# Patient Record
Sex: Female | Born: 1985 | Race: Black or African American | Hispanic: No | Marital: Single | Smoking: Never smoker
Health system: Southern US, Community
[De-identification: ages and names within clinical notes are randomized; demographics above are authoritative.]

## PROBLEM LIST (undated history)

## (undated) ENCOUNTER — Inpatient Hospital Stay (HOSPITAL_COMMUNITY): Payer: Self-pay

## (undated) DIAGNOSIS — M419 Scoliosis, unspecified: Secondary | ICD-10-CM

## (undated) DIAGNOSIS — O139 Gestational [pregnancy-induced] hypertension without significant proteinuria, unspecified trimester: Secondary | ICD-10-CM

## (undated) DIAGNOSIS — O039 Complete or unspecified spontaneous abortion without complication: Secondary | ICD-10-CM

## (undated) DIAGNOSIS — Z3490 Encounter for supervision of normal pregnancy, unspecified, unspecified trimester: Secondary | ICD-10-CM

## (undated) DIAGNOSIS — J45909 Unspecified asthma, uncomplicated: Secondary | ICD-10-CM

## (undated) HISTORY — PX: NO PAST SURGERIES: SHX2092

## (undated) HISTORY — DX: Gestational (pregnancy-induced) hypertension without significant proteinuria, unspecified trimester: O13.9

---
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
## 2008-04-12 ENCOUNTER — Emergency Department (HOSPITAL_COMMUNITY): Admission: EM | Admit: 2008-04-12 | Discharge: 2008-04-12 | Payer: Self-pay | Admitting: Emergency Medicine

## 2010-06-28 ENCOUNTER — Ambulatory Visit: Payer: Self-pay | Admitting: Diagnostic Radiology

## 2010-06-28 ENCOUNTER — Emergency Department (HOSPITAL_BASED_OUTPATIENT_CLINIC_OR_DEPARTMENT_OTHER)
Admission: EM | Admit: 2010-06-28 | Discharge: 2010-06-28 | Payer: Self-pay | Source: Home / Self Care | Admitting: Emergency Medicine

## 2012-10-24 ENCOUNTER — Encounter (HOSPITAL_COMMUNITY): Payer: Self-pay | Admitting: Family Medicine

## 2012-10-24 ENCOUNTER — Emergency Department (HOSPITAL_COMMUNITY)
Admission: EM | Admit: 2012-10-24 | Discharge: 2012-10-24 | Disposition: A | Discharge status: Home / Self Care | Payer: Medicaid Other | Attending: Emergency Medicine | Admitting: Emergency Medicine

## 2012-10-24 ENCOUNTER — Emergency Department (HOSPITAL_COMMUNITY): Payer: Medicaid Other

## 2012-10-24 DIAGNOSIS — M4120 Other idiopathic scoliosis, site unspecified: Secondary | ICD-10-CM | POA: Insufficient documentation

## 2012-10-24 DIAGNOSIS — M2550 Pain in unspecified joint: Secondary | ICD-10-CM | POA: Insufficient documentation

## 2012-10-24 DIAGNOSIS — N949 Unspecified condition associated with female genital organs and menstrual cycle: Secondary | ICD-10-CM | POA: Insufficient documentation

## 2012-10-24 DIAGNOSIS — J029 Acute pharyngitis, unspecified: Secondary | ICD-10-CM | POA: Insufficient documentation

## 2012-10-24 DIAGNOSIS — Z3490 Encounter for supervision of normal pregnancy, unspecified, unspecified trimester: Secondary | ICD-10-CM

## 2012-10-24 DIAGNOSIS — J3489 Other specified disorders of nose and nasal sinuses: Secondary | ICD-10-CM | POA: Insufficient documentation

## 2012-10-24 DIAGNOSIS — O368990 Maternal care for other specified fetal problems, unspecified trimester, not applicable or unspecified: Secondary | ICD-10-CM | POA: Insufficient documentation

## 2012-10-24 DIAGNOSIS — J45909 Unspecified asthma, uncomplicated: Secondary | ICD-10-CM | POA: Insufficient documentation

## 2012-10-24 DIAGNOSIS — R109 Unspecified abdominal pain: Secondary | ICD-10-CM | POA: Insufficient documentation

## 2012-10-24 DIAGNOSIS — O418X90 Other specified disorders of amniotic fluid and membranes, unspecified trimester, not applicable or unspecified: Secondary | ICD-10-CM

## 2012-10-24 HISTORY — DX: Encounter for supervision of normal pregnancy, unspecified, unspecified trimester: Z34.90

## 2012-10-24 HISTORY — DX: Complete or unspecified spontaneous abortion without complication: O03.9

## 2012-10-24 HISTORY — DX: Unspecified asthma, uncomplicated: J45.909

## 2012-10-24 HISTORY — DX: Scoliosis, unspecified: M41.9

## 2012-10-24 LAB — CBC WITH DIFFERENTIAL/PLATELET
Basophils Absolute: 0 10*3/uL (ref 0.0–0.1)
Eosinophils Absolute: 0 10*3/uL (ref 0.0–0.7)
Eosinophils Relative: 0 % (ref 0–5)
HCT: 36.6 % (ref 36.0–46.0)
Hemoglobin: 12.3 g/dL (ref 12.0–15.0)
Lymphocytes Relative: 21 % (ref 12–46)
Lymphs Abs: 1.8 10*3/uL (ref 0.7–4.0)
MCHC: 33.6 g/dL (ref 30.0–36.0)
Monocytes Absolute: 1.7 10*3/uL — ABNORMAL HIGH (ref 0.1–1.0)
RBC: 4.06 MIL/uL (ref 3.87–5.11)
RDW: 13.5 % (ref 11.5–15.5)

## 2012-10-24 LAB — WET PREP, GENITAL: Yeast Wet Prep HPF POC: NONE SEEN

## 2012-10-24 LAB — URINALYSIS, ROUTINE W REFLEX MICROSCOPIC
Glucose, UA: NEGATIVE mg/dL
Hgb urine dipstick: NEGATIVE
Ketones, ur: NEGATIVE mg/dL
Leukocytes, UA: NEGATIVE
Protein, ur: NEGATIVE mg/dL
Specific Gravity, Urine: 1.027 (ref 1.005–1.030)
Urobilinogen, UA: 0.2 mg/dL (ref 0.0–1.0)

## 2012-10-24 LAB — HCG, QUANTITATIVE, PREGNANCY: hCG, Beta Chain, Quant, S: 71627 m[IU]/mL — ABNORMAL HIGH (ref <5)

## 2012-10-24 LAB — BASIC METABOLIC PANEL
Creatinine, Ser: 0.63 mg/dL (ref 0.50–1.10)
Potassium: 3.6 mEq/L (ref 3.5–5.1)
Sodium: 134 mEq/L — ABNORMAL LOW (ref 135–145)

## 2012-10-24 NOTE — ED Notes (Signed)
Per pt sts hoarse and sore throat. sts she is also pregnant and has been having some abdominal cramping. Unsure how ar along she is. sts LMP beginning of October. Hasn't seen a Dr. yet

## 2012-10-24 NOTE — ED Provider Notes (Signed)
Patient placed in CDU by Caesar Bookman for OB ultrasound. Patient is here for abdominal cramping and has received IVF, labs, imaging and pelvic exam.  Pt with positive UPT and hCG of greater than 70,000.   Plan per previous provider is to obtain an OB US to r/o ectopic.  Patient re-evaluated and is resting comfortably, VSS, with no new complaints or concerns at this time.  On exam: hemodynamically stable, NAD, heart w/ RRR, lungs CTAB, Chest & abd non-tender, no peripheral edema or calf tenderness.  BP 118/66  Pulse 103  Temp 98.6 F (37 C) (Oral)  Resp 20  SpO2 100%  LMP 09/01/2012  Discussed with patient current lab and imaging results as well as their care plan, patient questions answered.  Patient is amenable to the plan.   5:46 PM [place] with: Single live IUP with EGA of [redacted] weeks 3 days; small sub-chorionic hemorrhage.  I discussed these findings with the patient and will have her followup with [facility] for prenatal care.  Pt with resolution of abdominal pain.  She states it occurs intermittently, but is not present at this time.  She is already taking her prenatal vitamins and will continue.  I have also discussed reasons to return immediately to the ER.  Patient expresses understanding and agrees with plan.  1. Medications: usual home medications including prenatal vitamins 2. Treatment: rest, drink plenty of fluids 3. Follow Up: Please followup with your primary doctor for discussion of your diagnoses and further evaluation after today's visit; if you do not have a primary care doctor use the resource guide provided to find one; followup with the [facility] obstetrics outpatient clinic   [facility], PA-C 10/24/12 1801

## 2012-10-24 NOTE — ED Provider Notes (Signed)
History     CSN: [id number]  Arrival date & time 10/24/12  6962   First MD Initiated Contact with Patient 10/24/12 1026      Chief Complaint  Patient presents with  . Abdominal Cramping  . Sore Throat    (Consider location/radiation/quality/duration/timing/severity/associated sxs/prior treatment) Patient is a 26 y.o. female presenting with cramps and URI. The history is provided by the patient.  Abdominal Cramping The primary symptoms of the illness include abdominal pain. The primary symptoms of the illness do not include fever, nausea, vomiting, dysuria, vaginal discharge or vaginal bleeding. The current episode started 3 to 5 hours ago.  Symptoms associated with the illness do not include chills. Associated symptoms comments: Patient early in pregnancy, G1P0, with lower abdominal cramping since this morning. She reports that "a doctor" told her she would be due in July, 2014. No vaginal bleeding or discharge. No dyspareunia. She denies fever, nausea, vomiting, dysuria, or change in bowel habits. Marland Kitchen  URI The primary symptoms include sore throat, abdominal pain and arthralgias. Primary symptoms do not include fever, cough, nausea, vomiting or rash.  The sore throat is not accompanied by trouble swallowing.  Symptoms associated with the illness include congestion. The illness is not associated with chills. Associated symptoms comments: Symptoms of nasal congestion, sore throat and joint aches for the past 3 days. No fever. .    Past Medical History  Diagnosis Date  . Asthma   . Pregnancy   . Scoliosis     History reviewed. No pertinent past surgical history.  History reviewed. No pertinent family history.  History  Substance Use Topics  . Smoking status: Never Smoker   . Smokeless tobacco: Not on file  . Alcohol Use: No    OB History    Grav Para Term Preterm Abortions TAB SAB Ect Mult Living   1               Review of Systems  Constitutional: Negative for fever  and chills.  HENT: Positive for congestion and sore throat. Negative for trouble swallowing.   Respiratory: Negative.  Negative for cough.   Cardiovascular: Negative.   Gastrointestinal: Positive for abdominal pain. Negative for nausea and vomiting.  Genitourinary: Positive for pelvic pain. Negative for dysuria, vaginal bleeding and vaginal discharge.  Musculoskeletal: Positive for arthralgias.  Skin: Negative.  Negative for rash.  Neurological: Negative.     Allergies  Review of patient's allergies indicates no known allergies.  Home Medications   Current Outpatient Rx  Name  Route  Sig  Dispense  Refill  . PRENATAL PO   Oral   Take 1 tablet by mouth daily.           BP 118/66  Pulse 103  Temp 98.6 F (37 C) (Oral)  Resp 20  SpO2 100%  LMP 09/01/2012  Physical Exam  Constitutional: She appears well-developed and well-nourished.  HENT:  Head: Normocephalic.  Neck: Normal range of motion. Neck supple.  Cardiovascular: Normal rate and regular rhythm.   Pulmonary/Chest: Effort normal and breath sounds normal.  Abdominal: Soft. Bowel sounds are normal. There is no tenderness. There is no rebound and no guarding.  Genitourinary: Vagina normal. No vaginal discharge found.       Cervical os closed, unremarkable in appearance without redness. No discharge or bleeding. Uterus enlarged. Non-tender cervix and adnexa.   Musculoskeletal: Normal range of motion.  Neurological: She is alert. No cranial nerve deficit.  Skin: Skin is warm and dry. No rash  noted.  Psychiatric: She has a normal mood and affect.    ED Course  Procedures (including critical care time)  Labs Reviewed  URINALYSIS, ROUTINE W REFLEX MICROSCOPIC - Abnormal; Notable for the following:    APPearance CLOUDY (*)     All other components within normal limits  PREGNANCY, URINE - Abnormal; Notable for the following:    Preg Test, Ur POSITIVE (*)     All other components within normal limits  BASIC  METABOLIC PANEL - Abnormal; Notable for the following:    Sodium 134 (*)     All other components within normal limits  CBC WITH DIFFERENTIAL - Abnormal; Notable for the following:    Monocytes Relative 20 (*)     Monocytes Absolute 1.7 (*)     All other components within normal limits  WET PREP, GENITAL - Abnormal; Notable for the following:    Clue Cells Wet Prep HPF POC FEW (*)     WBC, Wet Prep HPF POC FEW (*)     All other components within normal limits  HCG, QUANTITATIVE, PREGNANCY - Abnormal; Notable for the following:    hCG, Beta Chain, Quant, S [id number] (*)     All other components within normal limits  ABO/RH  GC/CHLAMYDIA PROBE AMP   No results found.   No diagnosis found.    MDM  Patient will be getting an ultrasound for evaluation of pelvic pain/cramping. Moved to CDU to await results. Plan: if no acute findings will refer to [facility] OB outpatient clinic for prenatal care.         Rodena Medin, PA-C 10/24/12 1428

## 2012-10-24 NOTE — ED Notes (Signed)
Pt undressed and placed in gown. Pt given red socks

## 2012-10-24 NOTE — ED Provider Notes (Signed)
Medical screening examination/treatment/procedure(s) were performed by non-physician practitioner and as supervising physician I was immediately available for consultation/collaboration.   Dione Booze, MD 10/24/12 [phone]

## 2012-10-24 NOTE — ED Notes (Signed)
Pelvic cart ready and placed in pt room

## 2012-10-25 LAB — GC/CHLAMYDIA PROBE AMP: GC Probe RNA: NEGATIVE

## 2012-11-04 NOTE — L&D Delivery Note (Signed)
I have seen and examined this patient and I agree with the above. Present for delivery. Tara Wheeler 3:15 AM 05/15/2013

## 2012-11-04 NOTE — L&D Delivery Note (Signed)
Delivery Note At 1:29 AM a viable female was delivered via Vaginal, Spontaneous Delivery (Presentation: vertex;  ).  APGAR: 8, 9; weight .   Placenta status: Intact, Spontaneous.  Cord:  with the following complications: None.    Anesthesia: Epidural  Episiotomy: None Lacerations: None Suture Repair:None Est. Blood Loss (mL): 200  Mom to postpartum.  Baby to nursery-stable.  Roxan Hockey, Winnie [place]ky 05/15/2013, 1:42 AM

## 2013-01-03 ENCOUNTER — Encounter (HOSPITAL_COMMUNITY): Payer: Self-pay | Admitting: Obstetrics and Gynecology

## 2013-01-03 ENCOUNTER — Inpatient Hospital Stay (HOSPITAL_COMMUNITY)
Admission: AD | Admit: 2013-01-03 | Discharge: 2013-01-03 | Disposition: A | Discharge status: Home / Self Care | Payer: Medicaid Other | Source: Ambulatory Visit | Attending: Obstetrics & Gynecology | Admitting: Obstetrics & Gynecology

## 2013-01-03 DIAGNOSIS — R109 Unspecified abdominal pain: Secondary | ICD-10-CM | POA: Insufficient documentation

## 2013-01-03 DIAGNOSIS — O99891 Other specified diseases and conditions complicating pregnancy: Secondary | ICD-10-CM | POA: Insufficient documentation

## 2013-01-03 DIAGNOSIS — Z1389 Encounter for screening for other disorder: Secondary | ICD-10-CM

## 2013-01-03 DIAGNOSIS — Z3490 Encounter for supervision of normal pregnancy, unspecified, unspecified trimester: Secondary | ICD-10-CM

## 2013-01-03 DIAGNOSIS — O0930 Supervision of pregnancy with insufficient antenatal care, unspecified trimester: Secondary | ICD-10-CM | POA: Insufficient documentation

## 2013-01-03 LAB — URINALYSIS, ROUTINE W REFLEX MICROSCOPIC
Bilirubin Urine: NEGATIVE
Leukocytes, UA: NEGATIVE
Nitrite: NEGATIVE
Protein, ur: NEGATIVE mg/dL
pH: 7.5 (ref 5.0–8.0)

## 2013-01-03 NOTE — MAU Provider Note (Signed)
Chief Complaint:  Abdominal Cramping   First Provider Initiated Contact with Patient 01/03/13 1123      HPI: Tara Wheeler is a 27 y.o. G2P0100 at [redacted]w[redacted]d who presents to maternity admissions reporting mild abdominal cramping which occurred yesterday.  She denies cramping today.  She came to MAU today because she has not yet had a prenatal appointment and she starts a new job this week and wants to make sure this is Ok.  She reports good fetal movement, denies LOF, vaginal bleeding, vaginal itching/burning, urinary symptoms, h/a, dizziness, n/v, or fever/chills.  .   Past Medical History: Past Medical History  Diagnosis Date  . Asthma   . Pregnancy   . Scoliosis   . Miscarriage     Past obstetric history: OB History   Grav Para Term Preterm Abortions TAB SAB Ect Mult Living   2 1  1       0     # Outc Date GA Lbr Len/2nd Wgt Sex Del Anes PTL Lv   1 PRE  [redacted]w[redacted]d       ND   2 CUR               Past Surgical History: History reviewed. No pertinent past surgical history.  Family History: History reviewed. No pertinent family history.  Social History: History  Substance Use Topics  . Smoking status: Never Smoker   . Smokeless tobacco: Not on file  . Alcohol Use: No    Allergies: No Known Allergies  Meds:  Prescriptions prior to admission  Medication Sig Dispense Refill  . Prenatal Vit-Fe Fumarate-FA (PRENATAL MULTIVITAMIN) TABS Take 1 tablet by mouth daily at 12 noon.        ROS: Pertinent findings in history of present illness.  Physical Exam  Blood pressure 135/72, pulse 103, temperature 98.3 F (36.8 C), temperature source Oral, resp. rate 18, height 5\' 5"  (1.651 m), weight 86.183 kg (190 lb), last menstrual period 09/01/2012. GENERAL: Well-developed, well-nourished female in no acute distress.  HEENT: normocephalic HEART: normal rate RESP: normal effort ABDOMEN: Soft, non-tender, gravid appropriate for gestational age EXTREMITIES: Nontender, no edema NEURO:  alert and oriented SPECULUM EXAM: deferred     FHT:  154 by doppler   Labs: Results for orders placed during the hospital encounter of 01/03/13 (from the past 24 hour(s))  URINALYSIS, ROUTINE W REFLEX MICROSCOPIC     Status: None   Collection Time    01/03/13 11:08 AM      Result Value Range   Color, Urine YELLOW  YELLOW   APPearance CLEAR  CLEAR   Specific Gravity, Urine 1.020  1.005 - 1.030   pH 7.5  5.0 - 8.0   Glucose, UA NEGATIVE  NEGATIVE mg/dL   Hgb urine dipstick NEGATIVE  NEGATIVE   Bilirubin Urine NEGATIVE  NEGATIVE   Ketones, ur NEGATIVE  NEGATIVE mg/dL   Protein, ur NEGATIVE  NEGATIVE mg/dL   Urobilinogen, UA 0.2  0.0 - 1.0 mg/dL   Nitrite NEGATIVE  NEGATIVE   Leukocytes, UA NEGATIVE  NEGATIVE     Assessment: 1. Normal IUP (intrauterine pregnancy) on prenatal ultrasound     Plan: Discharge home Reassurance provided about normal pregnancy, encouraged increase in PO fluids Ok to start new job, recommend pregnancy support belt since she will be standing at work Outpatient detail U/S ordered for this week Keep scheduled prenatal appointment at [facility] on 3/26 Return to MAU as needed  Follow-up Information   Follow up with [facility]. (  Your appointment is scheduled Wednesday 3/26 at 9:30 am with Georges Mouse, nurse-midwife.  Please call [phone] to schedule ultrasound this week.  )    Contact information:   [address] [place] [address] [phone]       Medication List    TAKE these medications       prenatal multivitamin Tabs  Take 1 tablet by mouth daily at 12 noon.        Sharen Counter Certified Nurse-Midwife 01/03/2013 11:41 AM

## 2013-01-03 NOTE — MAU Note (Addendum)
Patient presents to MAU for pregnancy checkup; denies vaginal bleeding, LOF, pain or cramping. Reports she is scheduled for appointment at clinic here on 3/23, but will begin new work assignment soon and wants to make sure baby is okay.  Patient states she has scoliosis and saw two bands at [facility] for back pain in pregnancy. Wants to know what kind to get.

## 2013-01-03 NOTE — MAU Note (Addendum)
Ms. Siedlecki is here today for a "checkup". She is approximately 16 weeks and has not started prenatal care. She was told she was pregnant at [facility] and also that she had a subchorionic hemorrhage. She is scheduled to go to the clinic at the end of March. She has been having cramping in her lower abdomen, denies bleeding.

## 2013-01-04 NOTE — MAU Provider Note (Signed)
Attestation of Attending Supervision of Advanced Practitioner (PA/CNM/NP): Evaluation and management procedures were performed by the Advanced Practitioner under my supervision and collaboration.  I have reviewed the Advanced Practitioner's note and chart, and I agree with the management and plan.  Kylen Ismael, MD, FACOG Attending Obstetrician & Gynecologist Faculty Practice, [facility]  

## 2013-01-06 ENCOUNTER — Ambulatory Visit (HOSPITAL_COMMUNITY)
Admission: RE | Admit: 2013-01-06 | Discharge: 2013-01-06 | Disposition: A | Discharge status: Home / Self Care | Payer: Medicaid Other | Source: Ambulatory Visit | Attending: Advanced Practice Midwife | Admitting: Advanced Practice Midwife

## 2013-01-06 DIAGNOSIS — Z363 Encounter for antenatal screening for malformations: Secondary | ICD-10-CM | POA: Insufficient documentation

## 2013-01-06 DIAGNOSIS — O358XX0 Maternal care for other (suspected) fetal abnormality and damage, not applicable or unspecified: Secondary | ICD-10-CM | POA: Insufficient documentation

## 2013-01-06 DIAGNOSIS — Z3490 Encounter for supervision of normal pregnancy, unspecified, unspecified trimester: Secondary | ICD-10-CM

## 2013-01-06 DIAGNOSIS — Z1389 Encounter for screening for other disorder: Secondary | ICD-10-CM | POA: Insufficient documentation

## 2013-01-08 ENCOUNTER — Encounter (HOSPITAL_COMMUNITY): Payer: Self-pay

## 2013-01-08 ENCOUNTER — Encounter (HOSPITAL_COMMUNITY): Payer: Self-pay | Admitting: Advanced Practice Midwife

## 2013-01-08 DIAGNOSIS — Z3490 Encounter for supervision of normal pregnancy, unspecified, unspecified trimester: Secondary | ICD-10-CM | POA: Insufficient documentation

## 2013-01-08 DIAGNOSIS — O0930 Supervision of pregnancy with insufficient antenatal care, unspecified trimester: Secondary | ICD-10-CM | POA: Insufficient documentation

## 2013-01-27 ENCOUNTER — Encounter: Payer: Medicaid Other | Admitting: Advanced Practice Midwife

## 2013-01-28 ENCOUNTER — Other Ambulatory Visit (HOSPITAL_COMMUNITY)
Admission: RE | Admit: 2013-01-28 | Discharge: 2013-01-28 | Disposition: A | Discharge status: Home / Self Care | Payer: Medicaid Other | Source: Ambulatory Visit | Attending: Obstetrics & Gynecology | Admitting: Obstetrics & Gynecology

## 2013-01-28 ENCOUNTER — Other Ambulatory Visit: Payer: Self-pay | Admitting: Obstetrics & Gynecology

## 2013-01-28 ENCOUNTER — Encounter: Payer: Self-pay | Admitting: Obstetrics & Gynecology

## 2013-01-28 ENCOUNTER — Ambulatory Visit (INDEPENDENT_AMBULATORY_CARE_PROVIDER_SITE_OTHER): Payer: Medicaid Other | Admitting: Obstetrics & Gynecology

## 2013-01-28 VITALS — BP 141/85 | Temp 98.3°F | Wt 193.4 lb

## 2013-01-28 DIAGNOSIS — O0930 Supervision of pregnancy with insufficient antenatal care, unspecified trimester: Secondary | ICD-10-CM

## 2013-01-28 DIAGNOSIS — Z113 Encounter for screening for infections with a predominantly sexual mode of transmission: Secondary | ICD-10-CM | POA: Insufficient documentation

## 2013-01-28 DIAGNOSIS — Z3492 Encounter for supervision of normal pregnancy, unspecified, second trimester: Secondary | ICD-10-CM

## 2013-01-28 DIAGNOSIS — Z01419 Encounter for gynecological examination (general) (routine) without abnormal findings: Secondary | ICD-10-CM | POA: Insufficient documentation

## 2013-01-28 LAB — POCT URINALYSIS DIP (DEVICE)
Hgb urine dipstick: NEGATIVE
Ketones, ur: NEGATIVE mg/dL
Nitrite: NEGATIVE
Protein, ur: 100 mg/dL — AB
Specific Gravity, Urine: 1.02 (ref 1.005–1.030)
pH: 7.5 (ref 5.0–8.0)
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
## 2013-01-28 NOTE — Patient Instructions (Signed)
Pregnancy - Second Trimester The second trimester of pregnancy (3 to 6 months) is a period of rapid growth for you and your baby. At the end of the sixth month, your baby is about 9 inches long and weighs 1 1/2 pounds. You will begin to feel the baby move between 18 and 20 weeks of the pregnancy. This is called quickening. Weight gain is faster. A clear fluid (colostrum) may leak out of your breasts. You may feel small contractions of the womb (uterus). This is known as false labor or Braxton-Hicks contractions. This is like a practice for labor when the baby is ready to be born. Usually, the problems with morning sickness have usually passed by the end of your first trimester. Some women develop small dark blotches (called cholasma, mask of pregnancy) on their face that usually goes away after the baby is born. Exposure to the sun makes the blotches worse. Acne may also develop in some pregnant women and pregnant women who have acne, may find that it goes away. PRENATAL EXAMS  Blood work may continue to be done during prenatal exams. These tests are done to check on your health and the probable health of your baby. Blood work is used to follow your blood levels (hemoglobin). Anemia (low hemoglobin) is common during pregnancy. Iron and vitamins are given to help prevent this. You will also be checked for diabetes between 24 and 28 weeks of the pregnancy. Some of the previous blood tests may be repeated.  The size of the uterus is measured during each visit. This is to make sure that the baby is continuing to grow properly according to the dates of the pregnancy.  Your blood pressure is checked every prenatal visit. This is to make sure you are not getting toxemia.  Your urine is checked to make sure you do not have an infection, diabetes or protein in the urine.  Your weight is checked often to make sure gains are happening at the suggested rate. This is to ensure that both you and your baby are growing  normally.  Sometimes, an ultrasound is performed to confirm the proper growth and development of the baby. This is a test which bounces harmless sound waves off the baby so your caregiver can more accurately determine due dates. Sometimes, a specialized test is done on the amniotic fluid surrounding the baby. This test is called an amniocentesis. The amniotic fluid is obtained by sticking a needle into the belly (abdomen). This is done to check the chromosomes in instances where there is a concern about possible genetic problems with the baby. It is also sometimes done near the end of pregnancy if an early delivery is required. In this case, it is done to help make sure the baby's lungs are mature enough for the baby to live outside of the womb. CHANGES OCCURING IN THE SECOND TRIMESTER OF PREGNANCY Your body goes through many changes during pregnancy. They vary from person to person. Talk to your caregiver about changes you notice that you are concerned about.  During the second trimester, you will likely have an increase in your appetite. It is normal to have cravings for certain foods. This varies from person to person and pregnancy to pregnancy.  Your lower abdomen will begin to bulge.  You may have to urinate more often because the uterus and baby are pressing on your bladder. It is also common to get more bladder infections during pregnancy (pain with urination). You can help this by   drinking lots of fluids and emptying your bladder before and after intercourse.  You may begin to get stretch marks on your hips, abdomen, and breasts. These are normal changes in the body during pregnancy. There are no exercises or medications to take that prevent this change.  You may begin to develop swollen and bulging veins (varicose veins) in your legs. Wearing support hose, elevating your feet for 15 minutes, 3 to 4 times a day and limiting salt in your diet helps lessen the problem.  Heartburn may develop  as the uterus grows and pushes up against the stomach. Antacids recommended by your caregiver helps with this problem. Also, eating smaller meals 4 to 5 times a day helps.  Constipation can be treated with a stool softener or adding bulk to your diet. Drinking lots of fluids, vegetables, fruits, and whole grains are helpful.  Exercising is also helpful. If you have been very active up until your pregnancy, most of these activities can be continued during your pregnancy. If you have been less active, it is helpful to start an exercise program such as walking.  Hemorrhoids (varicose veins in the rectum) may develop at the end of the second trimester. Warm sitz baths and hemorrhoid cream recommended by your caregiver helps hemorrhoid problems.  Backaches may develop during this time of your pregnancy. Avoid heavy lifting, wear low heal shoes and practice good posture to help with backache problems.  Some pregnant women develop tingling and numbness of their hand and fingers because of swelling and tightening of ligaments in the wrist (carpel tunnel syndrome). This goes away after the baby is born.  As your breasts enlarge, you may have to get a bigger bra. Get a comfortable, cotton, support bra. Do not get a nursing bra until the last month of the pregnancy if you will be nursing the baby.  You may get a dark line from your belly button to the pubic area called the linea nigra.  You may develop rosy cheeks because of increase blood flow to the face.  You may develop spider looking lines of the face, neck, arms and chest. These go away after the baby is born. HOME CARE INSTRUCTIONS   It is extremely important to avoid all smoking, herbs, alcohol, and unprescribed drugs during your pregnancy. These chemicals affect the formation and growth of the baby. Avoid these chemicals throughout the pregnancy to ensure the delivery of a healthy infant.  Most of your home care instructions are the same as  suggested for the first trimester of your pregnancy. Keep your caregiver's appointments. Follow your caregiver's instructions regarding medication use, exercise and diet.  During pregnancy, you are providing food for you and your baby. Continue to eat regular, well-balanced meals. Choose foods such as meat, fish, milk and other low fat dairy products, vegetables, fruits, and whole-grain breads and cereals. Your caregiver will tell you of the ideal weight gain.  A physical sexual relationship may be continued up until near the end of pregnancy if there are no other problems. Problems could include early (premature) leaking of amniotic fluid from the membranes, vaginal bleeding, abdominal pain, or other medical or pregnancy problems.  Exercise regularly if there are no restrictions. Check with your caregiver if you are unsure of the safety of some of your exercises. The greatest weight gain will occur in the last 2 trimesters of pregnancy. Exercise will help you:  Control your weight.  Get you in shape for labor and delivery.  Lose weight   after you have the baby.  Wear a good support or jogging bra for breast tenderness during pregnancy. This may help if worn during sleep. Pads or tissues may be used in the bra if you are leaking colostrum.  Do not use hot tubs, steam rooms or saunas throughout the pregnancy.  Wear your seat belt at all times when driving. This protects you and your baby if you are in an accident.  Avoid raw meat, uncooked cheese, cat litter boxes and soil used by cats. These carry germs that can cause birth defects in the baby.  The second trimester is also a good time to visit your dentist for your dental health if this has not been done yet. Getting your teeth cleaned is OK. Use a soft toothbrush. Brush gently during pregnancy.  It is easier to loose urine during pregnancy. Tightening up and strengthening the pelvic muscles will help with this problem. Practice stopping your  urination while you are going to the bathroom. These are the same muscles you need to strengthen. It is also the muscles you would use as if you were trying to stop from passing gas. You can practice tightening these muscles up 10 times a set and repeating this about 3 times per day. Once you know what muscles to tighten up, do not perform these exercises during urination. It is more likely to contribute to an infection by backing up the urine.  Ask for help if you have financial, counseling or nutritional needs during pregnancy. Your caregiver will be able to offer counseling for these needs as well as refer you for other special needs.  Your skin may become oily. If so, wash your face with mild soap, use non-greasy moisturizer and oil or cream based makeup. MEDICATIONS AND DRUG USE IN PREGNANCY  Take prenatal vitamins as directed. The vitamin should contain 1 milligram of folic acid. Keep all vitamins out of reach of children. Only a couple vitamins or tablets containing iron may be fatal to a baby or young child when ingested.  Avoid use of all medications, including herbs, over-the-counter medications, not prescribed or suggested by your caregiver. Only take over-the-counter or prescription medicines for pain, discomfort, or fever as directed by your caregiver. Do not use aspirin.  Let your caregiver also know about herbs you may be using.  Alcohol is related to a number of birth defects. This includes fetal alcohol syndrome. All alcohol, in any form, should be avoided completely. Smoking will cause low birth rate and premature babies.  Street or illegal drugs are very harmful to the baby. They are absolutely forbidden. A baby born to an addicted mother will be addicted at birth. The baby will go through the same withdrawal an adult does. SEEK MEDICAL CARE IF:  You have any concerns or worries during your pregnancy. It is better to call with your questions if you feel they cannot wait, rather  than worry about them. SEEK IMMEDIATE MEDICAL CARE IF:   An unexplained oral temperature above 102 F (38.9 C) develops, or as your caregiver suggests.  You have leaking of fluid from the vagina (birth canal). If leaking membranes are suspected, take your temperature and tell your caregiver of this when you call.  There is vaginal spotting, bleeding, or passing clots. Tell your caregiver of the amount and how many pads are used. Light spotting in pregnancy is common, especially following intercourse.  You develop a bad smelling vaginal discharge with a change in the color from clear   to white.  You continue to feel sick to your stomach (nauseated) and have no relief from remedies suggested. You vomit blood or coffee ground-like materials.  You lose more than 2 pounds of weight or gain more than 2 pounds of weight over 1 week, or as suggested by your caregiver.  You notice swelling of your face, hands, feet, or legs.  You get exposed to German measles and have never had them.  You are exposed to fifth disease or chickenpox.  You develop belly (abdominal) pain. Round ligament discomfort is a common non-cancerous (benign) cause of abdominal pain in pregnancy. Your caregiver still must evaluate you.  You develop a bad headache that does not go away.  You develop fever, diarrhea, pain with urination, or shortness of breath.  You develop visual problems, blurry, or double vision.  You fall or are in a car accident or any kind of trauma.  There is mental or physical violence at home. Document Released: 10/15/2001 Document Revised: 01/13/2012 Document Reviewed: 04/19/2009 [employer] Patient Information 2013 [employer].  

## 2013-01-29 LAB — OBSTETRIC PANEL
Antibody Screen: NEGATIVE
Eosinophils Absolute: 0.2 10*3/uL (ref 0.0–0.7)
Hepatitis B Surface Ag: NEGATIVE
Lymphocytes Relative: 17 % (ref 12–46)
MCH: 28.5 pg (ref 26.0–34.0)
RBC: 3.72 MIL/uL — ABNORMAL LOW (ref 3.87–5.11)
RDW: 14.6 % (ref 11.5–15.5)
Rh Type: POSITIVE

## 2013-02-01 LAB — HEMOGLOBINOPATHY EVALUATION
Hgb A2 Quant: 2.4 % (ref 2.2–3.2)
Hgb A: 97.3 % (ref 96.8–97.8)
Hgb F Quant: 0.3 % (ref 0.0–2.0)
Hgb S Quant: 0 %

## 2013-02-24 ENCOUNTER — Encounter: Payer: Medicaid Other | Admitting: Obstetrics and Gynecology

## 2013-03-03 ENCOUNTER — Ambulatory Visit (INDEPENDENT_AMBULATORY_CARE_PROVIDER_SITE_OTHER): Payer: Medicaid Other | Admitting: Obstetrics and Gynecology

## 2013-03-03 ENCOUNTER — Other Ambulatory Visit: Payer: Self-pay | Admitting: Obstetrics and Gynecology

## 2013-03-03 VITALS — BP 123/78 | Temp 97.5°F | Wt 193.5 lb

## 2013-03-03 DIAGNOSIS — O0933 Supervision of pregnancy with insufficient antenatal care, third trimester: Secondary | ICD-10-CM

## 2013-03-03 DIAGNOSIS — O0930 Supervision of pregnancy with insufficient antenatal care, unspecified trimester: Secondary | ICD-10-CM

## 2013-03-03 LAB — CBC
Hemoglobin: 10.2 g/dL — ABNORMAL LOW (ref 12.0–15.0)
MCH: 29.1 pg (ref 26.0–34.0)
RBC: 3.5 MIL/uL — ABNORMAL LOW (ref 3.87–5.11)
WBC: 10.3 10*3/uL (ref 4.0–10.5)

## 2013-03-03 LAB — POCT URINALYSIS DIP (DEVICE)
Glucose, UA: NEGATIVE mg/dL
Ketones, ur: NEGATIVE mg/dL
Protein, ur: 30 mg/dL — AB
Specific Gravity, Urine: >=1.03 (ref 1.005–1.030)
Urobilinogen, UA: 0.2 mg/dL (ref 0.0–1.0)

## 2013-03-03 NOTE — Progress Notes (Signed)
Doing well. Only complaint is pedal edema, worse during the day, improves with elevation of feet. Will obtain 1 hr glucola, CBC, RPR, and HIV today.

## 2013-03-03 NOTE — Progress Notes (Signed)
Pulse- 118  Edema-feet

## 2013-03-03 NOTE — Patient Instructions (Addendum)
Pregnancy - Third Trimester  The third trimester of pregnancy (the last 3 months) is a period of the most rapid growth for you and your baby. The baby approaches a length of 20 inches and a weight of 6 to 10 pounds. The baby is adding on fat and getting ready for life outside your body. While inside, babies have periods of sleeping and waking, suck their thumbs, and hiccups. You can often feel small contractions of the uterus. This is false labor. It is also called Braxton-Hicks contractions. This is like a practice for labor. The usual problems in this stage of pregnancy include more difficulty breathing, swelling of the hands and feet from water retention, and having to urinate more often because of the uterus and baby pressing on your bladder.   PRENATAL EXAMS  · Blood work may continue to be done during prenatal exams. These tests are done to check on your health and the probable health of your baby. Blood work is used to follow your blood levels (hemoglobin). Anemia (low hemoglobin) is common during pregnancy. Iron and vitamins are given to help prevent this. You may also continue to be checked for diabetes. Some of the past blood tests may be done again.  · The size of the uterus is measured during each visit. This makes sure your baby is growing properly according to your pregnancy dates.  · Your blood pressure is checked every prenatal visit. This is to make sure you are not getting toxemia.  · Your urine is checked every prenatal visit for infection, diabetes and protein.  · Your weight is checked at each visit. This is done to make sure gains are happening at the suggested rate and that you and your baby are growing normally.  · Sometimes, an ultrasound is performed to confirm the position and the proper growth and development of the baby. This is a test done that bounces harmless sound waves off the baby so your caregiver can more accurately determine due dates.  · Discuss the type of pain medication and  anesthesia you will have during your labor and delivery.  · Discuss the possibility and anesthesia if a Cesarean Section might be necessary.  · Inform your caregiver if there is any mental or physical violence at home.  Sometimes, a specialized non-stress test, contraction stress test and biophysical profile are done to make sure the baby is not having a problem. Checking the amniotic fluid surrounding the baby is called an amniocentesis. The amniotic fluid is removed by sticking a needle into the belly (abdomen). This is sometimes done near the end of pregnancy if an early delivery is required. In this case, it is done to help make sure the baby's lungs are mature enough for the baby to live outside of the womb. If the lungs are not mature and it is unsafe to deliver the baby, an injection of cortisone medication is given to the mother 1 to 2 days before the delivery. This helps the baby's lungs mature and makes it safer to deliver the baby.  CHANGES OCCURING IN THE THIRD TRIMESTER OF PREGNANCY  Your body goes through many changes during pregnancy. They vary from person to person. Talk to your caregiver about changes you notice and are concerned about.  · During the last trimester, you have probably had an increase in your appetite. It is normal to have cravings for certain foods. This varies from person to person and pregnancy to pregnancy.  · You may begin to   get stretch marks on your hips, abdomen, and breasts. These are normal changes in the body during pregnancy. There are no exercises or medications to take which prevent this change.  · Constipation may be treated with a stool softener or adding bulk to your diet. Drinking lots of fluids, fiber in vegetables, fruits, and whole grains are helpful.  · Exercising is also helpful. If you have been very active up until your pregnancy, most of these activities can be continued during your pregnancy. If you have been less active, it is helpful to start an exercise  program such as walking. Consult your caregiver before starting exercise programs.  · Avoid all smoking, alcohol, un-prescribed drugs, herbs and "street drugs" during your pregnancy. These chemicals affect the formation and growth of the baby. Avoid chemicals throughout the pregnancy to ensure the delivery of a healthy infant.  · Backache, varicose veins and hemorrhoids may develop or get worse.  · You will tire more easily in the third trimester, which is normal.  · The baby's movements may be stronger and more often.  · You may become short of breath easily.  · Your belly button may stick out.  · A yellow discharge may leak from your breasts called colostrum.  · You may have a bloody mucus discharge. This usually occurs a few days to a week before labor begins.  HOME CARE INSTRUCTIONS   · Keep your caregiver's appointments. Follow your caregiver's instructions regarding medication use, exercise, and diet.  · During pregnancy, you are providing food for you and your baby. Continue to eat regular, well-balanced meals. Choose foods such as meat, fish, milk and other low fat dairy products, vegetables, fruits, and whole-grain breads and cereals. Your caregiver will tell you of the ideal weight gain.  · A physical sexual relationship may be continued throughout pregnancy if there are no other problems such as early (premature) leaking of amniotic fluid from the membranes, vaginal bleeding, or belly (abdominal) pain.  · Exercise regularly if there are no restrictions. Check with your caregiver if you are unsure of the safety of your exercises. Greater weight gain will occur in the last 2 trimesters of pregnancy. Exercising helps:  · Control your weight.  · Get you in shape for labor and delivery.  · You lose weight after you deliver.  · Rest a lot with legs elevated, or as needed for leg cramps or low back pain.  · Wear a good support or jogging bra for breast tenderness during pregnancy. This may help if worn during  sleep. Pads or tissues may be used in the bra if you are leaking colostrum.  · Do not use hot tubs, steam rooms, or saunas.  · Wear your seat belt when driving. This protects you and your baby if you are in an accident.  · Avoid raw meat, cat litter boxes and soil used by cats. These carry germs that can cause birth defects in the baby.  · It is easier to loose urine during pregnancy. Tightening up and strengthening the pelvic muscles will help with this problem. You can practice stopping your urination while you are going to the bathroom. These are the same muscles you need to strengthen. It is also the muscles you would use if you were trying to stop from passing gas. You can practice tightening these muscles up 10 times a set and repeating this about 3 times per day. Once you know what muscles to tighten up, do not perform these   exercises during urination. It is more likely to cause an infection by backing up the urine.  · Ask for help if you have financial, counseling or nutritional needs during pregnancy. Your caregiver will be able to offer counseling for these needs as well as refer you for other special needs.  · Make a list of emergency phone numbers and have them available.  · Plan on getting help from family or friends when you go home from the hospital.  · Make a trial run to the hospital.  · Take prenatal classes with the father to understand, practice and ask questions about the labor and delivery.  · Prepare the baby's room/nursery.  · Do not travel out of the city unless it is absolutely necessary and with the advice of your caregiver.  · Wear only low or no heal shoes to have better balance and prevent falling.  MEDICATIONS AND DRUG USE IN PREGNANCY  · Take prenatal vitamins as directed. The vitamin should contain 1 milligram of folic acid. Keep all vitamins out of reach of children. Only a couple vitamins or tablets containing iron may be fatal to a baby or young child when ingested.  · Avoid use  of all medications, including herbs, over-the-counter medications, not prescribed or suggested by your caregiver. Only take over-the-counter or prescription medicines for pain, discomfort, or fever as directed by your caregiver. Do not use aspirin, ibuprofen (Motrin®, Advil®, Nuprin®) or naproxen (Aleve®) unless OK'd by your caregiver.  · Let your caregiver also know about herbs you may be using.  · Alcohol is related to a number of birth defects. This includes fetal alcohol syndrome. All alcohol, in any form, should be avoided completely. Smoking will cause low birth rate and premature babies.  · Street/illegal drugs are very harmful to the baby. They are absolutely forbidden. A baby born to an addicted mother will be addicted at birth. The baby will go through the same withdrawal an adult does.  SEEK MEDICAL CARE IF:  You have any concerns or worries during your pregnancy. It is better to call with your questions if you feel they cannot wait, rather than worry about them.  DECISIONS ABOUT CIRCUMCISION  You may or may not know the sex of your baby. If you know your baby is a boy, it may be time to think about circumcision. Circumcision is the removal of the foreskin of the penis. This is the skin that covers the sensitive end of the penis. There is no proven medical need for this. Often this decision is made on what is popular at the time or based upon religious beliefs and social issues. You can discuss these issues with your caregiver or pediatrician.  SEEK IMMEDIATE MEDICAL CARE IF:   · An unexplained oral temperature above 102° F (38.9° C) develops, or as your caregiver suggests.  · You have leaking of fluid from the vagina (birth canal). If leaking membranes are suspected, take your temperature and tell your caregiver of this when you call.  · There is vaginal spotting, bleeding or passing clots. Tell your caregiver of the amount and how many pads are used.  · You develop a bad smelling vaginal discharge with  a change in the color from clear to white.  · You develop vomiting that lasts more than 24 hours.  · You develop chills or fever.  · You develop shortness of breath.  · You develop burning on urination.  · You loose more than 2 pounds of weight   or gain more than 2 pounds of weight or as suggested by your caregiver.  · You notice sudden swelling of your face, hands, and feet or legs.  · You develop belly (abdominal) pain. Round ligament discomfort is a common non-cancerous (benign) cause of abdominal pain in pregnancy. Your caregiver still must evaluate you.  · You develop a severe headache that does not go away.  · You develop visual problems, blurred or double vision.  · If you have not felt your baby move for more than 1 hour. If you think the baby is not moving as much as usual, eat something with sugar in it and lie down on your left side for an hour. The baby should move at least 4 to 5 times per hour. Call right away if your baby moves less than that.  · You fall, are in a car accident or any kind of trauma.  · There is mental or physical violence at home.  Document Released: 10/15/2001 Document Revised: 01/13/2012 Document Reviewed: 04/19/2009  [employer]® Patient Information ©2013 [employer].

## 2013-03-04 LAB — RPR

## 2013-03-04 LAB — HIV ANTIBODY (ROUTINE TESTING W REFLEX): HIV: NONREACTIVE

## 2013-03-17 ENCOUNTER — Ambulatory Visit (INDEPENDENT_AMBULATORY_CARE_PROVIDER_SITE_OTHER): Payer: Medicaid Other | Admitting: Advanced Practice Midwife

## 2013-03-17 VITALS — BP 123/83 | Temp 97.6°F | Wt 197.0 lb

## 2013-03-17 DIAGNOSIS — O0930 Supervision of pregnancy with insufficient antenatal care, unspecified trimester: Secondary | ICD-10-CM

## 2013-03-17 DIAGNOSIS — O0933 Supervision of pregnancy with insufficient antenatal care, third trimester: Secondary | ICD-10-CM

## 2013-03-17 LAB — POCT URINALYSIS DIP (DEVICE)
Bilirubin Urine: NEGATIVE
Urobilinogen, UA: 0.2 mg/dL (ref 0.0–1.0)

## 2013-03-17 NOTE — Progress Notes (Signed)
Well, no c/o. Rev'd PTL, kick counts. 1 hour GCT normal.

## 2013-03-17 NOTE — Progress Notes (Signed)
P=81, 

## 2013-03-17 NOTE — Patient Instructions (Signed)
Pregnancy - Third Trimester  The third trimester of pregnancy (the last 3 months) is a period of the most rapid growth for you and your baby. The baby approaches a length of 20 inches and a weight of 6 to 10 pounds. The baby is adding on fat and getting ready for life outside your body. While inside, babies have periods of sleeping and waking, suck their thumbs, and hiccups. You can often feel small contractions of the uterus. This is false labor. It is also called Braxton-Hicks contractions. This is like a practice for labor. The usual problems in this stage of pregnancy include more difficulty breathing, swelling of the hands and feet from water retention, and having to urinate more often because of the uterus and baby pressing on your bladder.   PRENATAL EXAMS  · Blood work may continue to be done during prenatal exams. These tests are done to check on your health and the probable health of your baby. Blood work is used to follow your blood levels (hemoglobin). Anemia (low hemoglobin) is common during pregnancy. Iron and vitamins are given to help prevent this. You may also continue to be checked for diabetes. Some of the past blood tests may be done again.  · The size of the uterus is measured during each visit. This makes sure your baby is growing properly according to your pregnancy dates.  · Your blood pressure is checked every prenatal visit. This is to make sure you are not getting toxemia.  · Your urine is checked every prenatal visit for infection, diabetes and protein.  · Your weight is checked at each visit. This is done to make sure gains are happening at the suggested rate and that you and your baby are growing normally.  · Sometimes, an ultrasound is performed to confirm the position and the proper growth and development of the baby. This is a test done that bounces harmless sound waves off the baby so your caregiver can more accurately determine due dates.  · Discuss the type of pain medication and  anesthesia you will have during your labor and delivery.  · Discuss the possibility and anesthesia if a Cesarean Section might be necessary.  · Inform your caregiver if there is any mental or physical violence at home.  Sometimes, a specialized non-stress test, contraction stress test and biophysical profile are done to make sure the baby is not having a problem. Checking the amniotic fluid surrounding the baby is called an amniocentesis. The amniotic fluid is removed by sticking a needle into the belly (abdomen). This is sometimes done near the end of pregnancy if an early delivery is required. In this case, it is done to help make sure the baby's lungs are mature enough for the baby to live outside of the womb. If the lungs are not mature and it is unsafe to deliver the baby, an injection of cortisone medication is given to the mother 1 to 2 days before the delivery. This helps the baby's lungs mature and makes it safer to deliver the baby.  CHANGES OCCURING IN THE THIRD TRIMESTER OF PREGNANCY  Your body goes through many changes during pregnancy. They vary from person to person. Talk to your caregiver about changes you notice and are concerned about.  · During the last trimester, you have probably had an increase in your appetite. It is normal to have cravings for certain foods. This varies from person to person and pregnancy to pregnancy.  · You may begin to   get stretch marks on your hips, abdomen, and breasts. These are normal changes in the body during pregnancy. There are no exercises or medications to take which prevent this change.  · Constipation may be treated with a stool softener or adding bulk to your diet. Drinking lots of fluids, fiber in vegetables, fruits, and whole grains are helpful.  · Exercising is also helpful. If you have been very active up until your pregnancy, most of these activities can be continued during your pregnancy. If you have been less active, it is helpful to start an exercise  program such as walking. Consult your caregiver before starting exercise programs.  · Avoid all smoking, alcohol, un-prescribed drugs, herbs and "street drugs" during your pregnancy. These chemicals affect the formation and growth of the baby. Avoid chemicals throughout the pregnancy to ensure the delivery of a healthy infant.  · Backache, varicose veins and hemorrhoids may develop or get worse.  · You will tire more easily in the third trimester, which is normal.  · The baby's movements may be stronger and more often.  · You may become short of breath easily.  · Your belly button may stick out.  · A yellow discharge may leak from your breasts called colostrum.  · You may have a bloody mucus discharge. This usually occurs a few days to a week before labor begins.  HOME CARE INSTRUCTIONS   · Keep your caregiver's appointments. Follow your caregiver's instructions regarding medication use, exercise, and diet.  · During pregnancy, you are providing food for you and your baby. Continue to eat regular, well-balanced meals. Choose foods such as meat, fish, milk and other low fat dairy products, vegetables, fruits, and whole-grain breads and cereals. Your caregiver will tell you of the ideal weight gain.  · A physical sexual relationship may be continued throughout pregnancy if there are no other problems such as early (premature) leaking of amniotic fluid from the membranes, vaginal bleeding, or belly (abdominal) pain.  · Exercise regularly if there are no restrictions. Check with your caregiver if you are unsure of the safety of your exercises. Greater weight gain will occur in the last 2 trimesters of pregnancy. Exercising helps:  · Control your weight.  · Get you in shape for labor and delivery.  · You lose weight after you deliver.  · Rest a lot with legs elevated, or as needed for leg cramps or low back pain.  · Wear a good support or jogging bra for breast tenderness during pregnancy. This may help if worn during  sleep. Pads or tissues may be used in the bra if you are leaking colostrum.  · Do not use hot tubs, steam rooms, or saunas.  · Wear your seat belt when driving. This protects you and your baby if you are in an accident.  · Avoid raw meat, cat litter boxes and soil used by cats. These carry germs that can cause birth defects in the baby.  · It is easier to loose urine during pregnancy. Tightening up and strengthening the pelvic muscles will help with this problem. You can practice stopping your urination while you are going to the bathroom. These are the same muscles you need to strengthen. It is also the muscles you would use if you were trying to stop from passing gas. You can practice tightening these muscles up 10 times a set and repeating this about 3 times per day. Once you know what muscles to tighten up, do not perform these   exercises during urination. It is more likely to cause an infection by backing up the urine.  · Ask for help if you have financial, counseling or nutritional needs during pregnancy. Your caregiver will be able to offer counseling for these needs as well as refer you for other special needs.  · Make a list of emergency phone numbers and have them available.  · Plan on getting help from family or friends when you go home from the hospital.  · Make a trial run to the hospital.  · Take prenatal classes with the father to understand, practice and ask questions about the labor and delivery.  · Prepare the baby's room/nursery.  · Do not travel out of the city unless it is absolutely necessary and with the advice of your caregiver.  · Wear only low or no heal shoes to have better balance and prevent falling.  MEDICATIONS AND DRUG USE IN PREGNANCY  · Take prenatal vitamins as directed. The vitamin should contain 1 milligram of folic acid. Keep all vitamins out of reach of children. Only a couple vitamins or tablets containing iron may be fatal to a baby or young child when ingested.  · Avoid use  of all medications, including herbs, over-the-counter medications, not prescribed or suggested by your caregiver. Only take over-the-counter or prescription medicines for pain, discomfort, or fever as directed by your caregiver. Do not use aspirin, ibuprofen (Motrin®, Advil®, Nuprin®) or naproxen (Aleve®) unless OK'd by your caregiver.  · Let your caregiver also know about herbs you may be using.  · Alcohol is related to a number of birth defects. This includes fetal alcohol syndrome. All alcohol, in any form, should be avoided completely. Smoking will cause low birth rate and premature babies.  · Street/illegal drugs are very harmful to the baby. They are absolutely forbidden. A baby born to an addicted mother will be addicted at birth. The baby will go through the same withdrawal an adult does.  SEEK MEDICAL CARE IF:  You have any concerns or worries during your pregnancy. It is better to call with your questions if you feel they cannot wait, rather than worry about them.  DECISIONS ABOUT CIRCUMCISION  You may or may not know the sex of your baby. If you know your baby is a boy, it may be time to think about circumcision. Circumcision is the removal of the foreskin of the penis. This is the skin that covers the sensitive end of the penis. There is no proven medical need for this. Often this decision is made on what is popular at the time or based upon religious beliefs and social issues. You can discuss these issues with your caregiver or pediatrician.  SEEK IMMEDIATE MEDICAL CARE IF:   · An unexplained oral temperature above 102° F (38.9° C) develops, or as your caregiver suggests.  · You have leaking of fluid from the vagina (birth canal). If leaking membranes are suspected, take your temperature and tell your caregiver of this when you call.  · There is vaginal spotting, bleeding or passing clots. Tell your caregiver of the amount and how many pads are used.  · You develop a bad smelling vaginal discharge with  a change in the color from clear to white.  · You develop vomiting that lasts more than 24 hours.  · You develop chills or fever.  · You develop shortness of breath.  · You develop burning on urination.  · You loose more than 2 pounds of weight   or gain more than 2 pounds of weight or as suggested by your caregiver.  · You notice sudden swelling of your face, hands, and feet or legs.  · You develop belly (abdominal) pain. Round ligament discomfort is a common non-cancerous (benign) cause of abdominal pain in pregnancy. Your caregiver still must evaluate you.  · You develop a severe headache that does not go away.  · You develop visual problems, blurred or double vision.  · If you have not felt your baby move for more than 1 hour. If you think the baby is not moving as much as usual, eat something with sugar in it and lie down on your left side for an hour. The baby should move at least 4 to 5 times per hour. Call right away if your baby moves less than that.  · You fall, are in a car accident or any kind of trauma.  · There is mental or physical violence at home.  Document Released: 10/15/2001 Document Revised: 01/13/2012 Document Reviewed: 04/19/2009  Exit[employer]® Patient Information ©2013 [employer].

## 2013-03-31 ENCOUNTER — Ambulatory Visit (INDEPENDENT_AMBULATORY_CARE_PROVIDER_SITE_OTHER): Payer: Medicaid Other | Admitting: Obstetrics and Gynecology

## 2013-03-31 ENCOUNTER — Encounter: Payer: Self-pay | Admitting: Obstetrics and Gynecology

## 2013-03-31 VITALS — BP 117/78 | Temp 97.2°F | Wt 196.6 lb

## 2013-03-31 DIAGNOSIS — O26839 Pregnancy related renal disease, unspecified trimester: Secondary | ICD-10-CM

## 2013-03-31 DIAGNOSIS — O1213 Gestational proteinuria, third trimester: Secondary | ICD-10-CM

## 2013-03-31 LAB — POCT URINALYSIS DIP (DEVICE)
Leukocytes, UA: NEGATIVE
Specific Gravity, Urine: >=1.03 (ref 1.005–1.030)
Urobilinogen, UA: 0.2 mg/dL (ref 0.0–1.0)

## 2013-03-31 NOTE — Progress Notes (Signed)
Reviewed hx abortion age 27 ? wks (probably < 24, done in [place], cytotec, D&C after). No UTI sx. Will culture d/t proteinuria. Plans bottlefeed, encouraged try breast. Wants to return to work asap after delivery.

## 2013-03-31 NOTE — Progress Notes (Signed)
Pulse: 88

## 2013-04-14 ENCOUNTER — Inpatient Hospital Stay (HOSPITAL_COMMUNITY)
Admission: AD | Admit: 2013-04-14 | Discharge: 2013-04-14 | Disposition: A | Discharge status: Home / Self Care | Payer: Medicaid Other | Source: Ambulatory Visit | Attending: Obstetrics & Gynecology | Admitting: Obstetrics & Gynecology

## 2013-04-14 ENCOUNTER — Encounter (HOSPITAL_COMMUNITY): Payer: Self-pay | Admitting: *Deleted

## 2013-04-14 ENCOUNTER — Ambulatory Visit (INDEPENDENT_AMBULATORY_CARE_PROVIDER_SITE_OTHER): Payer: Medicaid Other | Admitting: Family

## 2013-04-14 VITALS — BP 143/92 | Temp 97.4°F | Wt 198.3 lb

## 2013-04-14 DIAGNOSIS — M4120 Other idiopathic scoliosis, site unspecified: Secondary | ICD-10-CM | POA: Insufficient documentation

## 2013-04-14 DIAGNOSIS — O99891 Other specified diseases and conditions complicating pregnancy: Secondary | ICD-10-CM | POA: Insufficient documentation

## 2013-04-14 DIAGNOSIS — O0930 Supervision of pregnancy with insufficient antenatal care, unspecified trimester: Secondary | ICD-10-CM

## 2013-04-14 DIAGNOSIS — O163 Unspecified maternal hypertension, third trimester: Secondary | ICD-10-CM

## 2013-04-14 DIAGNOSIS — O169 Unspecified maternal hypertension, unspecified trimester: Secondary | ICD-10-CM

## 2013-04-14 DIAGNOSIS — R030 Elevated blood-pressure reading, without diagnosis of hypertension: Secondary | ICD-10-CM | POA: Insufficient documentation

## 2013-04-14 DIAGNOSIS — M549 Dorsalgia, unspecified: Secondary | ICD-10-CM | POA: Insufficient documentation

## 2013-04-14 LAB — COMPREHENSIVE METABOLIC PANEL
ALT: 15 U/L (ref 0–35)
AST: 14 U/L (ref 0–37)
Albumin: 2.7 g/dL — ABNORMAL LOW (ref 3.5–5.2)
Alkaline Phosphatase: 137 U/L — ABNORMAL HIGH (ref 39–117)
CO2: 20 mEq/L (ref 19–32)
Chloride: 103 mEq/L (ref 96–112)
Creatinine, Ser: 0.58 mg/dL (ref 0.50–1.10)
GFR calc non Af Amer: >90 mL/min (ref >90)
Potassium: 4 mEq/L (ref 3.5–5.1)
Sodium: 135 mEq/L (ref 135–145)
Total Bilirubin: 0.1 mg/dL — ABNORMAL LOW (ref 0.3–1.2)

## 2013-04-14 LAB — POCT URINALYSIS DIP (DEVICE)
Bilirubin Urine: NEGATIVE
Ketones, ur: NEGATIVE mg/dL
Protein, ur: NEGATIVE mg/dL
Specific Gravity, Urine: >=1.03 (ref 1.005–1.030)

## 2013-04-14 LAB — CBC
MCV: 86.2 fL (ref 78.0–100.0)
Platelets: 314 10*3/uL (ref 150–400)
RBC: 3.63 MIL/uL — ABNORMAL LOW (ref 3.87–5.11)
RDW: 14.3 % (ref 11.5–15.5)
WBC: 10.7 10*3/uL — ABNORMAL HIGH (ref 4.0–10.5)

## 2013-04-14 LAB — OB RESULTS CONSOLE GBS: GBS: NEGATIVE

## 2013-04-14 LAB — PROTEIN / CREATININE RATIO, URINE
Creatinine, Urine: 191.01 mg/dL
Protein Creatinine Ratio: 0.06 (ref 0.00–0.15)
Total Protein, Urine: 12.2 mg/dL

## 2013-04-14 MED ORDER — INTEGRA F 125-1 MG PO CAPS: 1.0000 | ORAL_CAPSULE | Freq: Every day | ORAL | Status: DC

## 2013-04-14 NOTE — Progress Notes (Signed)
No report of headache, epigastric pain, or vision changes.  To MAU for evaluation of labs.  GBS and GC/CT collected.  Letter given to pt advising to minimize stress due to increasing blood pressure.

## 2013-04-14 NOTE — MAU Provider Note (Signed)
  History     CSN: [id number]  Arrival date and time: 04/14/13 1225   First Provider Initiated Contact with Patient 04/14/13 1357      Chief Complaint  Patient presents with  . Hypertension   HPI Tara Wheeler is a 27 y.o. G2P0100 at [redacted]w[redacted]d who presents from [facility] clinic for evaluation of elevated BP. BP 143/92 earlier this morning.  Patient asymptomatic with no SOB, RUQ pain, visual changes, or headache.  Patient reports that she is feeling well.  She denies any bleeding, loss of fluid, or contractions.  Past Medical History  Diagnosis Date  . Asthma   . Pregnancy   . Scoliosis   . Miscarriage     History reviewed. No pertinent past surgical history.  Family History  Problem Relation Age of Onset  . Hypertension Mother   . Hypertension Father   . Diabetes Maternal Aunt     History  Substance Use Topics  . Smoking status: Never Smoker   . Smokeless tobacco: Not on file  . Alcohol Use: No    Allergies: No Known Allergies  Prescriptions prior to admission  Medication Sig Dispense Refill  . Calcium-Vitamin D-Vitamin K (CALCIUM SOFT CHEWS PO) Take by mouth.      . Prenatal Vit-Fe Fumarate-FA (PRENATAL MULTIVITAMIN) TABS Take 1 tablet by mouth daily at 12 noon.      . Fe Fum-FePoly-FA-Vit C-Vit B3 (INTEGRA F) 125-1 MG CAPS Take 1 tablet by mouth daily.  30 capsule  1    ROS Per HPI Physical Exam   Blood pressure 128/80, pulse 85, temperature 98.4 F (36.9 C), temperature source Oral, resp. rate 16, last menstrual period 09/01/2012, SpO2 99.00%.  Physical Exam Gen: well appearing, NAD. Heart: RRR. No murmurs. Lungs: CTAB, no rales, rhonchi, or wheezing. Abd: gravid but otherwise soft, nontender to palpation Ext: no appreciable lower extremity edema bilaterally Neuro: no focal deficits.   FHR: baseline 145, mod variability, 15x15 accels, no decels Toco: None noted  MAU Course  Procedures  Assessment and Plan  Tara Wheeler is a 27 y.o. G2P0100  at [redacted]w[redacted]d who presents from [facility] clinic for evaluation of elevated BP. - BP currently well controlled at 128/80. - CBC and CMP unremarkable (normal LFT's and platelet count).  UPC ratio 0.06. - Will discharge patient home with close outpatient follow up.   Everlene Other 04/14/2013, 1:57 PM   I saw and examined patient and agree with above resident note. I reviewed history, imaging, labs, and vitals. I personally reviewed the fetal heart tracing, and it is reactive. Napoleon Form, MD

## 2013-04-14 NOTE — Progress Notes (Signed)
Pulse- 106 BP Recheck 144/91

## 2013-04-14 NOTE — MAU Note (Signed)
Patient states she was seen at the [facility] for a regular visit and had elevated blood pressure. Was sent to MAU for further evaluation. Patient denies any problems other than a mild back ache. Patient states she has scoliosis. Reports good fetal movement.

## 2013-04-15 LAB — GC/CHLAMYDIA PROBE AMP: GC Probe RNA: NEGATIVE

## 2013-04-16 NOTE — MAU Provider Note (Signed)
Attestation of Attending Supervision of Advanced Practitioner (CNM/NP): Evaluation and management procedures were performed by the Advanced Practitioner under my supervision and collaboration.  I have reviewed the Advanced Practitioner's note and chart, and I agree with the management and plan.  HARRAWAY-SMITH, Jonia Oakey 8:52 AM

## 2013-04-17 ENCOUNTER — Encounter: Payer: Self-pay | Admitting: Family

## 2013-04-21 ENCOUNTER — Ambulatory Visit (INDEPENDENT_AMBULATORY_CARE_PROVIDER_SITE_OTHER): Payer: Medicaid Other | Admitting: Advanced Practice Midwife

## 2013-04-21 VITALS — BP 150/91 | Temp 98.0°F | Wt 201.0 lb

## 2013-04-21 DIAGNOSIS — O139 Gestational [pregnancy-induced] hypertension without significant proteinuria, unspecified trimester: Secondary | ICD-10-CM

## 2013-04-21 DIAGNOSIS — O133 Gestational [pregnancy-induced] hypertension without significant proteinuria, third trimester: Secondary | ICD-10-CM

## 2013-04-21 LAB — POCT URINALYSIS DIP (DEVICE)
Bilirubin Urine: NEGATIVE
Hgb urine dipstick: NEGATIVE
Leukocytes, UA: NEGATIVE
Nitrite: NEGATIVE
Protein, ur: 30 mg/dL — AB
pH: 5.5 (ref 5.0–8.0)

## 2013-04-21 LAB — COMPREHENSIVE METABOLIC PANEL
AST: 13 U/L (ref 0–37)
Alkaline Phosphatase: 141 U/L — ABNORMAL HIGH (ref 39–117)
BUN: 7 mg/dL (ref 6–23)
Glucose, Bld: 76 mg/dL (ref 70–99)
Sodium: 136 mEq/L (ref 135–145)
Total Bilirubin: 0.2 mg/dL — ABNORMAL LOW (ref 0.3–1.2)
Total Protein: 6.6 g/dL (ref 6.0–8.3)

## 2013-04-21 LAB — CBC
Hemoglobin: 10.3 g/dL — ABNORMAL LOW (ref 12.0–15.0)
MCH: 28.3 pg (ref 26.0–34.0)
MCHC: 34.4 g/dL (ref 30.0–36.0)
RDW: 15.1 % (ref 11.5–15.5)

## 2013-04-21 MED ORDER — LABETALOL HCL 100 MG PO TABS: 100.0000 mg | ORAL_TABLET | Freq: Two times a day (BID) | ORAL | Status: DC

## 2013-04-21 MED ORDER — LABETALOL HCL 200 MG PO TABS: 200.0000 mg | ORAL_TABLET | Freq: Two times a day (BID) | ORAL | Status: DC

## 2013-04-21 MED ORDER — FERROUS FUMARATE-FOLIC ACID 324-1 MG PO TABS: 1.0000 | ORAL_TABLET | Freq: Every day | ORAL | Status: DC

## 2013-04-21 NOTE — Addendum Note (Signed)
Addended by: Franchot Mimes on: 04/21/2013 11:04 AM   Modules accepted: Orders

## 2013-04-21 NOTE — Progress Notes (Signed)
Recheck BP 138/87.  Denies headache or visual changes. Will start Labetalol 100mg  bid. Will recheck labs and Pr/Cr ratio today as outpatient. Reviewed warning signs. Discussed need to find pediatrician.  Will pump breastmilk. Wants circ, prob. In hospital discussed costs.  Cannot afford Iron Rx previously prescribed. Will submit Hemocyte F Rx, should be covered by [facility]

## 2013-04-21 NOTE — Progress Notes (Signed)
Pulse: 108

## 2013-04-21 NOTE — Patient Instructions (Addendum)

## 2013-04-22 LAB — PROTEIN / CREATININE RATIO, URINE
Protein Creatinine Ratio: 0.07 (ref <0.15)
Total Protein, Urine: 16 mg/dL

## 2013-04-28 ENCOUNTER — Ambulatory Visit (INDEPENDENT_AMBULATORY_CARE_PROVIDER_SITE_OTHER): Payer: Medicaid Other | Admitting: Advanced Practice Midwife

## 2013-04-28 VITALS — BP 108/70 | Temp 97.9°F | Wt 200.6 lb

## 2013-04-28 DIAGNOSIS — O133 Gestational [pregnancy-induced] hypertension without significant proteinuria, third trimester: Secondary | ICD-10-CM

## 2013-04-28 DIAGNOSIS — O139 Gestational [pregnancy-induced] hypertension without significant proteinuria, unspecified trimester: Secondary | ICD-10-CM

## 2013-04-28 LAB — POCT URINALYSIS DIP (DEVICE)
Bilirubin Urine: NEGATIVE
Ketones, ur: NEGATIVE mg/dL
Specific Gravity, Urine: >=1.03 (ref 1.005–1.030)

## 2013-04-28 NOTE — Progress Notes (Signed)
Pulse- 102 Patient reports lower back pain; also reports headaches since starting labetalol

## 2013-04-28 NOTE — Progress Notes (Signed)
Will back down to 50mg  bid on Labetalol   Doing well otherwise. Discussed circumcision.  Chose [facility].

## 2013-04-28 NOTE — Patient Instructions (Signed)
Vaginal Delivery  Your caregiver must first be sure you are in labor. Signs of labor include:   You may pass what is called "the mucus plug" before labor begins. This is a small amount of blood stained mucus.   Regular uterine contractions.   The time between contractions get closer together.   The discomfort and pain gradually gets more intense.   Pains are mostly located in the back.   Pains get worse when walking.   The cervix (the opening of the uterus) becomes thinner (begins to efface) and opens up (dilates).  Once you are in labor and admitted into the hospital or care center, your caregiver will do the following:   A complete physical examination.   Check your vital signs (blood pressure, pulse, temperature and the fetal heart rate).   Do a vaginal examination (using a sterile glove and lubricant) to determine:   The position (presentation) of the baby (head [vertex] or buttock first).   The level (station) of the baby's head in the birth canal.   The effacement and dilatation of the cervix.   You may have your pubic hair shaved and be given an enema depending on your caregiver and the circumstance.   An electronic monitor is usually placed on your abdomen. The monitor follows the length and intensity of the contractions, as well as the baby's heart rate.   Usually, your caregiver will insert an IV in your arm with a bottle of sugar water. This is done as a precaution so that medications can be given to you quickly during labor or delivery.  NORMAL LABOR AND DELIVERY IS DIVIDED UP INTO 3 STAGES:  First Stage  This is when regular contractions begin and the cervix begins to efface and dilate. This stage can last from 3 to 15 hours. The end of the first stage is when the cervix is 100% effaced and 10 centimeters dilated. Pain medications may be given by    Injection (morphine, demerol, etc.)    Regional anesthesia (spinal, caudal or epidural, anesthetics given in different locations of the spine). Paracervical pain medication may be given, which is an injection of and anesthetic on each side of the cervix.  A pregnant woman may request to have "Natural Childbirth" which is not to have any medications or anesthesia during her labor and delivery.  Second Stage  This is when the baby comes down through the birth canal (vagina) and is born. This can take 1 to 4 hours. As the baby's head comes down through the birth canal, you may feel like you are going to have a bowel movement. You will get the urge to bear down and push until the baby is delivered. As the baby's head is being delivered, the caregiver will decide if an episiotomy (a cut in the perineum and vagina area) is needed to prevent tearing of the tissue in this area. The episiotomy is sewn up after the delivery of the baby and placenta. Sometimes a mask with nitrous oxide is given for the mother to breath during the delivery of the baby to help if there is too much pain. The end of Stage 2 is when the baby is fully delivered. Then when the umbilical cord stops pulsating it is clamped and cut.  Third Stage  The third stage begins after the baby is completely delivered and ends after the placenta (afterbirth) is delivered. This usually takes 5 to 30 minutes. After the placenta is delivered, a medication is given   either by intravenous or injection to help contract the uterus and prevent bleeding. The third stage is not painful and pain medication is usually not necessary. If an episiotomy was done, it is repaired at this time.  After the delivery, the mother is watched and monitored closely for 1 to 2 hours to make sure there is no postpartum bleeding (hemorrhage). If there is a lot of bleeding, medication is given to contract the uterus and stop the bleeding.  Document Released: 07/30/2008 Document Revised: 07/15/2012 Document Reviewed: 07/30/2008   [employer] Patient Information 2014 [employer].

## 2013-05-06 ENCOUNTER — Ambulatory Visit (INDEPENDENT_AMBULATORY_CARE_PROVIDER_SITE_OTHER): Payer: Medicaid Other | Admitting: Advanced Practice Midwife

## 2013-05-06 VITALS — BP 123/87 | Temp 97.9°F | Wt 202.6 lb

## 2013-05-06 DIAGNOSIS — O139 Gestational [pregnancy-induced] hypertension without significant proteinuria, unspecified trimester: Secondary | ICD-10-CM

## 2013-05-06 DIAGNOSIS — O0933 Supervision of pregnancy with insufficient antenatal care, third trimester: Secondary | ICD-10-CM

## 2013-05-06 DIAGNOSIS — O1213 Gestational proteinuria, third trimester: Secondary | ICD-10-CM

## 2013-05-06 DIAGNOSIS — O0930 Supervision of pregnancy with insufficient antenatal care, unspecified trimester: Secondary | ICD-10-CM

## 2013-05-06 DIAGNOSIS — O133 Gestational [pregnancy-induced] hypertension without significant proteinuria, third trimester: Secondary | ICD-10-CM

## 2013-05-06 DIAGNOSIS — O26839 Pregnancy related renal disease, unspecified trimester: Secondary | ICD-10-CM

## 2013-05-06 LAB — POCT URINALYSIS DIP (DEVICE)
Bilirubin Urine: NEGATIVE
Ketones, ur: NEGATIVE mg/dL
Protein, ur: NEGATIVE mg/dL
Specific Gravity, Urine: >=1.03 (ref 1.005–1.030)
pH: 5.5 (ref 5.0–8.0)

## 2013-05-06 NOTE — Patient Instructions (Signed)
Vaginal Delivery Your caregiver must first be sure you are in labor. Signs of labor include:  You may pass what is called "the mucus plug" before labor begins. This is a small amount of blood stained mucus.  Regular uterine contractions.  The time between contractions get closer together.  The discomfort and pain gradually gets more intense.  Pains are mostly located in the back.  Pains get worse when walking.  The cervix (the opening of the uterus) becomes thinner (begins to efface) and opens up (dilates). Once you are in labor and admitted into the hospital or care center, your caregiver will do the following:  A complete physical examination.  Check your vital signs (blood pressure, pulse, temperature and the fetal heart rate).  Do a vaginal examination (using a sterile glove and lubricant) to determine:  The position (presentation) of the baby (head [vertex] or buttock first).  The level (station) of the baby's head in the birth canal.  The effacement and dilatation of the cervix.  You may have your pubic hair shaved and be given an enema depending on your caregiver and the circumstance.  An electronic monitor is usually placed on your abdomen. The monitor follows the length and intensity of the contractions, as well as the baby's heart rate.  Usually, your caregiver will insert an IV in your arm with a bottle of sugar water. This is done as a precaution so that medications can be given to you quickly during labor or delivery. NORMAL LABOR AND DELIVERY IS DIVIDED UP INTO 3 STAGES: First Stage This is when regular contractions begin and the cervix begins to efface and dilate. This stage can last from 3 to 15 hours. The end of the first stage is when the cervix is 100% effaced and 10 centimeters dilated. Pain medications may be given by   Injection (morphine, demerol, etc.)  Regional anesthesia (spinal, caudal or epidural, anesthetics given in different locations of the  spine). Paracervical pain medication may be given, which is an injection of and anesthetic on each side of the cervix. A pregnant woman may request to have "Natural Childbirth" which is not to have any medications or anesthesia during her labor and delivery. Second Stage This is when the baby comes down through the birth canal (vagina) and is born. This can take 1 to 4 hours. As the baby's head comes down through the birth canal, you may feel like you are going to have a bowel movement. You will get the urge to bear down and push until the baby is delivered. As the baby's head is being delivered, the caregiver will decide if an episiotomy (a cut in the perineum and vagina area) is needed to prevent tearing of the tissue in this area. The episiotomy is sewn up after the delivery of the baby and placenta. Sometimes a mask with nitrous oxide is given for the mother to breath during the delivery of the baby to help if there is too much pain. The end of Stage 2 is when the baby is fully delivered. Then when the umbilical cord stops pulsating it is clamped and cut. Third Stage The third stage begins after the baby is completely delivered and ends after the placenta (afterbirth) is delivered. This usually takes 5 to 30 minutes. After the placenta is delivered, a medication is given either by intravenous or injection to help contract the uterus and prevent bleeding. The third stage is not painful and pain medication is usually not necessary. If an  episiotomy was done, it is repaired at this time. After the delivery, the mother is watched and monitored closely for 1 to 2 hours to make sure there is no postpartum bleeding (hemorrhage). If there is a lot of bleeding, medication is given to contract the uterus and stop the bleeding. Document Released: 07/30/2008 Document Revised: 07/15/2012 Document Reviewed: 07/30/2008 [facility] Patient Information 2014 [place], [place].  Fetal Movement Counts Patient Name:  __________________________________________________ Patient Due Date: ____________________ Performing a fetal movement count is highly recommended in high-risk pregnancies, but it is good for every pregnant woman to do. Your caregiver may ask you to start counting fetal movements at 28 weeks of the pregnancy. Fetal movements often increase:  After eating a full meal.  After physical activity.  After eating or drinking something sweet or cold.  At rest. Pay attention to when you feel the baby is most active. This will help you notice a pattern of your baby's sleep and wake cycles and what factors contribute to an increase in fetal movement. It is important to perform a fetal movement count at the same time each day when your baby is normally most active.  HOW TO COUNT FETAL MOVEMENTS 1. Find a quiet and comfortable area to sit or lie down on your left side. Lying on your left side provides the best blood and oxygen circulation to your baby. 2. Write down the day and time on a sheet of paper or in a journal. 3. Start counting kicks, flutters, swishes, rolls, or jabs in a 2 hour period. You should feel at least 10 movements within 2 hours. 4. If you do not feel 10 movements in 2 hours, wait 2 3 hours and count again. Look for a change in the pattern or not enough counts in 2 hours. SEEK MEDICAL CARE IF:  You feel less than 10 counts in 2 hours, tried twice.  There is no movement in over an hour.  The pattern is changing or taking longer each day to reach 10 counts in 2 hours.  You feel the baby is not moving as he or she usually does. Date: ____________ Movements: ____________ Start time: ____________ Doreatha Martin time: ____________  Date: ____________ Movements: ____________ Start time: ____________ Doreatha Martin time: ____________ Date: ____________ Movements: ____________ Start time: ____________ Doreatha Martin time: ____________ Date: ____________ Movements: ____________ Start time: ____________ Doreatha Martin time:  ____________ Date: ____________ Movements: ____________ Start time: ____________ Doreatha Martin time: ____________ Date: ____________ Movements: ____________ Start time: ____________ Doreatha Martin time: ____________ Date: ____________ Movements: ____________ Start time: ____________ Doreatha Martin time: ____________ Date: ____________ Movements: ____________ Start time: ____________ Doreatha Martin time: ____________  Date: ____________ Movements: ____________ Start time: ____________ Doreatha Martin time: ____________ Date: ____________ Movements: ____________ Start time: ____________ Doreatha Martin time: ____________ Date: ____________ Movements: ____________ Start time: ____________ Doreatha Martin time: ____________ Date: ____________ Movements: ____________ Start time: ____________ Doreatha Martin time: ____________ Date: ____________ Movements: ____________ Start time: ____________ Doreatha Martin time: ____________ Date: ____________ Movements: ____________ Start time: ____________ Doreatha Martin time: ____________ Date: ____________ Movements: ____________ Start time: ____________ Doreatha Martin time: ____________  Date: ____________ Movements: ____________ Start time: ____________ Doreatha Martin time: ____________ Date: ____________ Movements: ____________ Start time: ____________ Doreatha Martin time: ____________ Date: ____________ Movements: ____________ Start time: ____________ Doreatha Martin time: ____________ Date: ____________ Movements: ____________ Start time: ____________ Doreatha Martin time: ____________ Date: ____________ Movements: ____________ Start time: ____________ Doreatha Martin time: ____________ Date: ____________ Movements: ____________ Start time: ____________ Doreatha Martin time: ____________ Date: ____________ Movements: ____________ Start time: ____________ Doreatha Martin time: ____________  Date: ____________ Movements: ____________ Start time: ____________ Doreatha Martin time: ____________ Date: ____________  Movements: ____________ Start time: ____________ Doreatha Martin time: ____________ Date: ____________ Movements:  ____________ Start time: ____________ Doreatha Martin time: ____________ Date: ____________ Movements: ____________ Start time: ____________ Doreatha Martin time: ____________ Date: ____________ Movements: ____________ Start time: ____________ Doreatha Martin time: ____________ Date: ____________ Movements: ____________ Start time: ____________ Doreatha Martin time: ____________ Date: ____________ Movements: ____________ Start time: ____________ Doreatha Martin time: ____________  Date: ____________ Movements: ____________ Start time: ____________ Doreatha Martin time: ____________ Date: ____________ Movements: ____________ Start time: ____________ Doreatha Martin time: ____________ Date: ____________ Movements: ____________ Start time: ____________ Doreatha Martin time: ____________ Date: ____________ Movements: ____________ Start time: ____________ Doreatha Martin time: ____________ Date: ____________ Movements: ____________ Start time: ____________ Doreatha Martin time: ____________ Date: ____________ Movements: ____________ Start time: ____________ Doreatha Martin time: ____________ Date: ____________ Movements: ____________ Start time: ____________ Doreatha Martin time: ____________  Date: ____________ Movements: ____________ Start time: ____________ Doreatha Martin time: ____________ Date: ____________ Movements: ____________ Start time: ____________ Doreatha Martin time: ____________ Date: ____________ Movements: ____________ Start time: ____________ Doreatha Martin time: ____________ Date: ____________ Movements: ____________ Start time: ____________ Doreatha Martin time: ____________ Date: ____________ Movements: ____________ Start time: ____________ Doreatha Martin time: ____________ Date: ____________ Movements: ____________ Start time: ____________ Doreatha Martin time: ____________ Date: ____________ Movements: ____________ Start time: ____________ Doreatha Martin time: ____________  Date: ____________ Movements: ____________ Start time: ____________ Doreatha Martin time: ____________ Date: ____________ Movements: ____________ Start time: ____________ Doreatha Martin  time: ____________ Date: ____________ Movements: ____________ Start time: ____________ Doreatha Martin time: ____________ Date: ____________ Movements: ____________ Start time: ____________ Doreatha Martin time: ____________ Date: ____________ Movements: ____________ Start time: ____________ Doreatha Martin time: ____________ Date: ____________ Movements: ____________ Start time: ____________ Doreatha Martin time: ____________ Date: ____________ Movements: ____________ Start time: ____________ Doreatha Martin time: ____________  Date: ____________ Movements: ____________ Start time: ____________ Doreatha Martin time: ____________ Date: ____________ Movements: ____________ Start time: ____________ Doreatha Martin time: ____________ Date: ____________ Movements: ____________ Start time: ____________ Doreatha Martin time: ____________ Date: ____________ Movements: ____________ Start time: ____________ Doreatha Martin time: ____________ Date: ____________ Movements: ____________ Start time: ____________ Doreatha Martin time: ____________ Date: ____________ Movements: ____________ Start time: ____________ Doreatha Martin time: ____________ Document Released: 11/20/2006 Document Revised: 10/07/2012 Document Reviewed: 08/17/2012 [employer] Patient Information 2014 [place].  Hypertension During Pregnancy Hypertension is also called high blood pressure. It can occur at any time in life and during pregnancy. When you have hypertension, there is extra pressure inside your blood vessels that carry blood from the heart to the rest of your body (arteries). Hypertension during pregnancy can cause problems for you and your baby. Your baby might not weigh as much as it should at birth or might be born early (premature). Very bad cases of hypertension during pregnancy can be life-threatening.  There are different types of hypertension during pregnancy.   Chronic hypertension. This happens when a woman has hypertension before pregnancy and it continues during pregnancy.  Gestational hypertension. This is  when hypertension develops during pregnancy.  Preeclampsia or toxemia of pregnancy. This is a very serious type of hypertension that develops only during pregnancy. It is a disease that affects the whole body (systemic) and can be very dangerous for both mother and baby.  Gestational hypertension and preeclampsia usually go away after your baby is born. Blood pressure generally stabilizes within 6 weeks. Women who have hypertension during pregnancy have a greater chance of developing hypertension later in life or with future pregnancies. UNDERSTANDING BLOOD PRESSURE Blood pressure moves blood in your body. Sometimes, the force that moves the blood becomes too strong.  A blood pressure reading is given in 2 numbers and looks like a fraction.  The top number is called the systolic pressure. When your heart beats,  it forces more blood to flow through the arteries. Pressure inside the arteries goes up.  The bottom number is the diastolic pressure. Pressure goes down between beats. That is when the heart is resting.  You may have hypertension if:  Your systolic blood pressure is above 140.  Your diastolic pressure is above 90. RISK FACTORS Some factors make you more likely to develop hypertension during pregnancy. Risk factors include:  Having hypertension before pregnancy.  Having hypertension during a previous pregnancy.  Being overweight.  Being older than 40.  Being pregnant with more than 1 baby (multiples).  Having diabetes or kidney problems. SYMPTOMS Chronic and gestational hypertension may not cause symptoms. Preeclampsia has symptoms, which may include:  Increased protein in your urine. Your caregiver will check for this at every prenatal visit.  Swelling of your hands and face.  Rapid weight gain.  Headaches.  Visual changes.  Being bothered by light.  Abdominal pain, especially in the right upper area.  Chest pain.  Shortness of breath.  Increased  reflexes.  Seizures. Seizures occur with a more severe form of preeclampsia, called eclampsia. DIAGNOSIS   You may be diagnosed with hypertension during pregnancy during a regular prenatal exam. At each visit, tests may include:  Blood pressure checks.  A urine test to check for protein in your urine.  The type of hypertension you are diagnosed with depends on when you developed it. It also depends on your specific blood pressure reading.  Developing hypertension before 20 weeks of pregnancy is consistent with chronic hypertension.  Developing hypertension after 20 weeks of pregnancy is consistent with gestational hypertension.  Hypertension with increased urinary protein is diagnosed as preeclampsia.  Blood pressure measurements that stay above 160 systolic or 110 diastolic are a sign of severe preeclampsia. TREATMENT Treatment for hypertension during pregnancy varies. Treatment depends on the type of hypertension and how serious it is.  If you take medicine for chronic hypertension, you may need to switch medicines.  Drugs called ACE inhibitors should not be taken during pregnancy.  Low-dose aspirin may be suggested for women who have risk factors for preeclampsia.  If you have gestational hypertension, you may need to take a blood pressure medicine that is safe during pregnancy. Your caregiver will recommend the appropriate medicine.  If you have severe preeclampsia, you may need to be in the hospital. Caregivers will watch you and the baby very closely. You also may need to take medicine (magnesium sulfate) to prevent seizures and lower blood pressure.  Sometimes an early delivery is needed. This may be the case if the condition worsens. It would be done to protect you and the baby. The only cure for preeclampsia is delivery. HOME CARE INSTRUCTIONS  Schedule and keep all of your regular prenatal care.  Follow your caregiver's instructions for taking medicines. Tell your  caregiver about all medicines you take. This includes over-the-counter medicines.  Eat as little salt as possible.  Get regular exercise.  Do not drink alcohol.  Do not use tobacco products.  Do not drink products with caffeine.  Lie on your left side when resting.  Tell your doctor if you have any preeclampsia symptoms. SEEK IMMEDIATE MEDICAL CARE IF:  You have severe abdominal pain.  You have sudden swelling in the hands, ankles, or face.  You gain 4 pounds (1.8 kg) or more in 1 week.  You vomit repeatedly.  You have vaginal bleeding.  You do not feel the baby moving as much.  You  have a headache.  You have blurred or double vision.  You have muscle twitching or spasms.  You have shortness of breath.  You have blue fingernails and lips.  You have blood in your urine. MAKE SURE YOU:  Understand these instructions.  Will watch your condition.  Will get help right away if you are not doing well. Document Released: 07/09/2011 Document Revised: 01/13/2012 Document Reviewed: 07/09/2011 [facility] Patient Information 2014 [place], [place].

## 2013-05-06 NOTE — Progress Notes (Signed)
P=90, 

## 2013-05-06 NOTE — Progress Notes (Signed)
No PIH Sx. On labetalol.Plan IOL at 39 weeks. GBS neg.

## 2013-05-07 DIAGNOSIS — O133 Gestational [pregnancy-induced] hypertension without significant proteinuria, third trimester: Secondary | ICD-10-CM | POA: Insufficient documentation

## 2013-05-10 ENCOUNTER — Encounter (HOSPITAL_COMMUNITY): Payer: Self-pay | Admitting: *Deleted

## 2013-05-10 ENCOUNTER — Telehealth (HOSPITAL_COMMUNITY): Payer: Self-pay | Admitting: *Deleted

## 2013-05-10 ENCOUNTER — Ambulatory Visit (INDEPENDENT_AMBULATORY_CARE_PROVIDER_SITE_OTHER): Payer: Medicaid Other | Admitting: *Deleted

## 2013-05-10 VITALS — BP 123/77 | Wt 204.1 lb

## 2013-05-10 DIAGNOSIS — O139 Gestational [pregnancy-induced] hypertension without significant proteinuria, unspecified trimester: Secondary | ICD-10-CM

## 2013-05-10 DIAGNOSIS — O133 Gestational [pregnancy-induced] hypertension without significant proteinuria, third trimester: Secondary | ICD-10-CM

## 2013-05-10 NOTE — Progress Notes (Signed)
P = 93   Pt denies PIH sx, [place] for growth on 05/11/13.  Pt's mother expressed concern about IOL @ 39wks. Reasons for this recommendation explained.  Pt agrees to IOL on 05/14/13 @ 0700.  Sx of pre-eclampsia reviewed and pt was advised to return for any of these sx as well as for decr. FM- voiced understanding.

## 2013-05-10 NOTE — Telephone Encounter (Signed)
Preadmission screen  

## 2013-05-11 ENCOUNTER — Ambulatory Visit (HOSPITAL_COMMUNITY)
Admission: RE | Admit: 2013-05-11 | Discharge: 2013-05-11 | Disposition: A | Discharge status: Home / Self Care | Payer: Medicaid Other | Source: Ambulatory Visit | Attending: Advanced Practice Midwife | Admitting: Advanced Practice Midwife

## 2013-05-11 DIAGNOSIS — O139 Gestational [pregnancy-induced] hypertension without significant proteinuria, unspecified trimester: Secondary | ICD-10-CM | POA: Insufficient documentation

## 2013-05-11 DIAGNOSIS — Z3689 Encounter for other specified antenatal screening: Secondary | ICD-10-CM | POA: Insufficient documentation

## 2013-05-11 DIAGNOSIS — O133 Gestational [pregnancy-induced] hypertension without significant proteinuria, third trimester: Secondary | ICD-10-CM

## 2013-05-11 NOTE — Progress Notes (Signed)
NST performed today was reviewed and was found to be reactive.  Continue recommended antenatal testing and prenatal care.  

## 2013-05-14 ENCOUNTER — Inpatient Hospital Stay (HOSPITAL_COMMUNITY)
Admission: RE | Admit: 2013-05-14 | Discharge: 2013-05-16 | DRG: 775 | Disposition: A | Discharge status: Home / Self Care | Payer: Medicaid Other | Source: Ambulatory Visit | Attending: Obstetrics & Gynecology | Admitting: Obstetrics & Gynecology

## 2013-05-14 ENCOUNTER — Encounter (HOSPITAL_COMMUNITY): Payer: Self-pay

## 2013-05-14 VITALS — BP 110/66 | HR 82 | Temp 97.4°F | Resp 18 | Ht 66.0 in | Wt 204.0 lb

## 2013-05-14 DIAGNOSIS — O139 Gestational [pregnancy-induced] hypertension without significant proteinuria, unspecified trimester: Principal | ICD-10-CM | POA: Diagnosis present

## 2013-05-14 DIAGNOSIS — O0930 Supervision of pregnancy with insufficient antenatal care, unspecified trimester: Secondary | ICD-10-CM

## 2013-05-14 LAB — CBC
HCT: 33.8 % — ABNORMAL LOW (ref 36.0–46.0)
Hemoglobin: 10.9 g/dL — ABNORMAL LOW (ref 12.0–15.0)
RBC: 3.97 MIL/uL (ref 3.87–5.11)

## 2013-05-14 LAB — RPR: RPR Ser Ql: NONREACTIVE

## 2013-05-14 MED ORDER — LACTATED RINGERS IV SOLN
500.0000 mL | Freq: Once | INTRAVENOUS | Status: AC
  Administered 2013-05-14: 500 mL via INTRAVENOUS

## 2013-05-14 MED ORDER — DIPHENHYDRAMINE HCL 50 MG/ML IJ SOLN: 12.5000 mg | INTRAMUSCULAR | Status: DC | PRN

## 2013-05-14 MED ORDER — PHENYLEPHRINE 40 MCG/ML (10ML) SYRINGE FOR IV PUSH (FOR BLOOD PRESSURE SUPPORT)
80.0000 ug | PREFILLED_SYRINGE | INTRAVENOUS | Status: DC | PRN
  Filled 2013-05-14: qty 5
  Filled 2013-05-14: qty 2

## 2013-05-14 MED ORDER — CITRIC ACID-SODIUM CITRATE 334-500 MG/5ML PO SOLN: 30.0000 mL | ORAL | Status: DC | PRN

## 2013-05-14 MED ORDER — OXYTOCIN BOLUS FROM INFUSION
500.0000 mL | INTRAVENOUS | Status: DC
  Administered 2013-05-15: 500 mL via INTRAVENOUS

## 2013-05-14 MED ORDER — OXYTOCIN 40 UNITS IN LACTATED RINGERS INFUSION - SIMPLE MED
62.5000 mL/h | INTRAVENOUS | Status: DC
  Filled 2013-05-14: qty 1000

## 2013-05-14 MED ORDER — OXYCODONE-ACETAMINOPHEN 5-325 MG PO TABS: 1.0000 | ORAL_TABLET | ORAL | Status: DC | PRN

## 2013-05-14 MED ORDER — LACTATED RINGERS IV SOLN
INTRAVENOUS | Status: DC
  Administered 2013-05-14: 15:00:00 via INTRAVENOUS

## 2013-05-14 MED ORDER — LIDOCAINE HCL (PF) 1 % IJ SOLN
30.0000 mL | INTRAMUSCULAR | Status: DC | PRN
  Filled 2013-05-14 (×2): qty 30

## 2013-05-14 MED ORDER — TERBUTALINE SULFATE 1 MG/ML IJ SOLN: 0.2500 mg | Freq: Once | INTRAMUSCULAR | Status: AC | PRN

## 2013-05-14 MED ORDER — LACTATED RINGERS IV SOLN: 500.0000 mL | INTRAVENOUS | Status: DC | PRN

## 2013-05-14 MED ORDER — IBUPROFEN 600 MG PO TABS: 600.0000 mg | ORAL_TABLET | Freq: Four times a day (QID) | ORAL | Status: DC | PRN

## 2013-05-14 MED ORDER — PHENYLEPHRINE 40 MCG/ML (10ML) SYRINGE FOR IV PUSH (FOR BLOOD PRESSURE SUPPORT)
80.0000 ug | PREFILLED_SYRINGE | INTRAVENOUS | Status: DC | PRN
  Filled 2013-05-14: qty 2

## 2013-05-14 MED ORDER — EPHEDRINE 5 MG/ML INJ
10.0000 mg | INTRAVENOUS | Status: DC | PRN
  Filled 2013-05-14: qty 2

## 2013-05-14 MED ORDER — MISOPROSTOL 25 MCG QUARTER TABLET
50.0000 ug | ORAL_TABLET | Freq: Once | ORAL | Status: AC
  Administered 2013-05-14: 50 ug via ORAL
  Filled 2013-05-14 (×2): qty 0.25

## 2013-05-14 MED ORDER — MISOPROSTOL 25 MCG QUARTER TABLET
25.0000 ug | ORAL_TABLET | ORAL | Status: DC | PRN
  Administered 2013-05-14: 25 ug via VAGINAL
  Filled 2013-05-14: qty 1
  Filled 2013-05-14: qty 0.25

## 2013-05-14 MED ORDER — NALBUPHINE SYRINGE 5 MG/0.5 ML
10.0000 mg | INJECTION | INTRAMUSCULAR | Status: DC | PRN
  Administered 2013-05-14: 10 mg via INTRAVENOUS
  Filled 2013-05-14 (×2): qty 1

## 2013-05-14 MED ORDER — ACETAMINOPHEN 325 MG PO TABS: 650.0000 mg | ORAL_TABLET | ORAL | Status: DC | PRN

## 2013-05-14 MED ORDER — FENTANYL 2.5 MCG/ML BUPIVACAINE 1/10 % EPIDURAL INFUSION (WH - ANES)
14.0000 mL/h | INTRAMUSCULAR | Status: DC | PRN
  Filled 2013-05-14: qty 125

## 2013-05-14 MED ORDER — EPHEDRINE 5 MG/ML INJ
10.0000 mg | INTRAVENOUS | Status: DC | PRN
  Filled 2013-05-14: qty 2
  Filled 2013-05-14: qty 4

## 2013-05-14 MED ORDER — ONDANSETRON HCL 4 MG/2ML IJ SOLN: 4.0000 mg | Freq: Four times a day (QID) | INTRAMUSCULAR | Status: DC | PRN
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
## 2013-05-14 NOTE — Progress Notes (Signed)
Notifed resident of admission status, gestational age, SVE, FHR tracing, UC, reason for IOL. Provider on the way for POC.

## 2013-05-14 NOTE — H&P (Signed)
Attestation of Attending Supervision of Obstetric Fellow: Evaluation and management procedures were performed by the Obstetric Fellow under my supervision and collaboration.  I have reviewed the Obstetric Fellow's note and chart, and I agree with the management and plan.  Donis Kotowski, MD, FACOG Attending Obstetrician & Gynecologist Faculty Practice, [facility]   

## 2013-05-15 ENCOUNTER — Encounter (HOSPITAL_COMMUNITY): Payer: Self-pay

## 2013-05-15 ENCOUNTER — Inpatient Hospital Stay (HOSPITAL_COMMUNITY): Payer: Medicaid Other | Admitting: Anesthesiology

## 2013-05-15 ENCOUNTER — Encounter (HOSPITAL_COMMUNITY): Payer: Self-pay | Admitting: Anesthesiology

## 2013-05-15 DIAGNOSIS — O139 Gestational [pregnancy-induced] hypertension without significant proteinuria, unspecified trimester: Secondary | ICD-10-CM

## 2013-05-15 LAB — CBC
Hemoglobin: 10.5 g/dL — ABNORMAL LOW (ref 12.0–15.0)
MCHC: 33.1 g/dL (ref 30.0–36.0)
WBC: 11.7 10*3/uL — ABNORMAL HIGH (ref 4.0–10.5)

## 2013-05-15 MED ORDER — WITCH HAZEL-GLYCERIN EX PADS: 1.0000 "application " | MEDICATED_PAD | CUTANEOUS | Status: DC | PRN

## 2013-05-15 MED ORDER — LIDOCAINE HCL (PF) 1 % IJ SOLN
INTRAMUSCULAR | Status: DC | PRN
  Administered 2013-05-15: 3 mL
  Administered 2013-05-15 (×2): 5 mL

## 2013-05-15 MED ORDER — DIBUCAINE 1 % RE OINT: 1.0000 "application " | TOPICAL_OINTMENT | RECTAL | Status: DC | PRN

## 2013-05-15 MED ORDER — SIMETHICONE 80 MG PO CHEW: 80.0000 mg | CHEWABLE_TABLET | ORAL | Status: DC | PRN

## 2013-05-15 MED ORDER — TETANUS-DIPHTH-ACELL PERTUSSIS 5-2.5-18.5 LF-MCG/0.5 IM SUSP: 0.5000 mL | Freq: Once | INTRAMUSCULAR | Status: DC

## 2013-05-15 MED ORDER — OXYCODONE-ACETAMINOPHEN 5-325 MG PO TABS
1.0000 | ORAL_TABLET | ORAL | Status: DC | PRN
  Administered 2013-05-15: 1 via ORAL
  Filled 2013-05-15: qty 1

## 2013-05-15 MED ORDER — ONDANSETRON HCL 4 MG/2ML IJ SOLN: 4.0000 mg | INTRAMUSCULAR | Status: DC | PRN

## 2013-05-15 MED ORDER — PRENATAL MULTIVITAMIN CH
1.0000 | ORAL_TABLET | Freq: Every day | ORAL | Status: DC
  Administered 2013-05-15: 1 via ORAL
  Filled 2013-05-15: qty 1

## 2013-05-15 MED ORDER — DIPHENHYDRAMINE HCL 25 MG PO CAPS
25.0000 mg | ORAL_CAPSULE | Freq: Four times a day (QID) | ORAL | Status: DC | PRN
  Administered 2013-05-15 – 2013-05-16 (×2): 25 mg via ORAL
  Filled 2013-05-15 (×2): qty 1

## 2013-05-15 MED ORDER — SENNOSIDES-DOCUSATE SODIUM 8.6-50 MG PO TABS
2.0000 | ORAL_TABLET | Freq: Every day | ORAL | Status: DC
  Administered 2013-05-15: 2 via ORAL

## 2013-05-15 MED ORDER — LANOLIN HYDROUS EX OINT: TOPICAL_OINTMENT | CUTANEOUS | Status: DC | PRN

## 2013-05-15 MED ORDER — ZOLPIDEM TARTRATE 5 MG PO TABS: 5.0000 mg | ORAL_TABLET | Freq: Every evening | ORAL | Status: DC | PRN

## 2013-05-15 MED ORDER — ONDANSETRON HCL 4 MG PO TABS: 4.0000 mg | ORAL_TABLET | ORAL | Status: DC | PRN

## 2013-05-15 MED ORDER — IBUPROFEN 600 MG PO TABS
600.0000 mg | ORAL_TABLET | Freq: Four times a day (QID) | ORAL | Status: DC
  Administered 2013-05-15 – 2013-05-16 (×5): 600 mg via ORAL
  Filled 2013-05-15 (×4): qty 1

## 2013-05-15 MED ORDER — FENTANYL 2.5 MCG/ML BUPIVACAINE 1/10 % EPIDURAL INFUSION (WH - ANES)
INTRAMUSCULAR | Status: DC | PRN
  Administered 2013-05-15: 14 mL/h via EPIDURAL

## 2013-05-15 MED ORDER — BENZOCAINE-MENTHOL 20-0.5 % EX AERO: 1.0000 "application " | INHALATION_SPRAY | CUTANEOUS | Status: DC | PRN

## 2013-05-15 NOTE — Anesthesia Procedure Notes (Signed)
Epidural Patient location during procedure: OB  Staffing Anesthesiologist: Coyt Govoni Performed by: anesthesiologist   Preanesthetic Checklist Completed: patient identified, site marked, surgical consent, pre-op evaluation, timeout performed, IV checked, risks and benefits discussed and monitors and equipment checked  Epidural Patient position: sitting Prep: ChloraPrep Patient monitoring: heart rate, continuous pulse ox and blood pressure Approach: midline Injection technique: LOR saline  Needle:  Needle type: Tuohy  Needle gauge: 17 G Needle length: 9 cm and 9 Needle insertion depth: 7 cm Catheter type: closed end flexible Catheter size: 20 Guage Catheter at skin depth: 14 cm Test dose: negative  Assessment Events: blood not aspirated, injection not painful, no injection resistance, negative IV test and no paresthesia  Additional Notes   Patient tolerated the insertion well without complications.   

## 2013-05-15 NOTE — Anesthesia Postprocedure Evaluation (Signed)
Anesthesia Post Note  Patient: Tara Wheeler  Procedure(s) Performed: * No procedures listed *  Anesthesia type: Epidural  Patient location: Mother/Baby  Post pain: Pain level controlled  Post assessment: Post-op Vital signs reviewed  Last Vitals:  Filed Vitals:   05/15/13 0500  BP: 106/67  Pulse: 94  Temp: 36.8 C  Resp: 18    Post vital signs: Reviewed  Level of consciousness: awake  Complications: No apparent anesthesia complications

## 2013-05-15 NOTE — Anesthesia Preprocedure Evaluation (Signed)
Anesthesia Evaluation  Patient identified by MRN, date of birth, ID band Patient awake    Reviewed: Allergy & Precautions, H&P , NPO status , Patient's Chart, lab work & pertinent test results  History of Anesthesia Complications Negative for: history of anesthetic complications  Airway Mallampati: II TM Distance: >3 FB Neck ROM: full    Dental no notable dental hx. (+) Teeth Intact   Pulmonary neg pulmonary ROS, asthma ,  breath sounds clear to auscultation  Pulmonary exam normal       Cardiovascular hypertension, negative cardio ROS  Rhythm:regular Rate:Normal     Neuro/Psych negative neurological ROS  negative psych ROS   GI/Hepatic negative GI ROS, Neg liver ROS,   Endo/Other  negative endocrine ROS  Renal/GU negative Renal ROS  negative genitourinary   Musculoskeletal   Abdominal Normal abdominal exam  (+)   Peds  Hematology negative hematology ROS (+)   Anesthesia Other Findings   Reproductive/Obstetrics (+) Pregnancy                           Anesthesia Physical Anesthesia Plan  ASA: II  Anesthesia Plan: Epidural   Post-op Pain Management:    Induction:   Airway Management Planned:   Additional Equipment:   Intra-op Plan:   Post-operative Plan:   Informed Consent: I have reviewed the patients History and Physical, chart, labs and discussed the procedure including the risks, benefits and alternatives for the proposed anesthesia with the patient or authorized representative who has indicated his/her understanding and acceptance.     Plan Discussed with:   Anesthesia Plan Comments:         Anesthesia Quick Evaluation

## 2013-05-16 MED ORDER — GUAIFENESIN 100 MG/5ML PO SOLN
200.0000 mg | ORAL | Status: DC | PRN
  Administered 2013-05-16: 200 mg via ORAL
  Filled 2013-05-16: qty 15

## 2013-05-16 MED ORDER — IBUPROFEN 600 MG PO TABS: 600.0000 mg | ORAL_TABLET | Freq: Four times a day (QID) | ORAL | Status: DC | PRN

## 2013-05-16 MED ORDER — GUAIFENESIN 100 MG/5ML PO SYRP
200.0000 mg | ORAL_SOLUTION | ORAL | Status: DC | PRN
  Filled 2013-05-16: qty 10

## 2013-05-16 NOTE — Discharge Summary (Signed)
Obstetric Discharge Summary Reason for Admission: induction of labor d/t GHTN Prenatal Procedures: ultrasound Intrapartum Procedures: spontaneous vaginal delivery Postpartum Procedures: none Complications-Operative and Postpartum: none Eating, drinking, voiding, ambulating well.  +flatus.  Lochia and pain wnl.  Denies dizziness, lightheadedness, or sob. Has had cold since day admitted. Robitussin helping. Recommended not to take any OTC cold meds d/t potential increase in BP, may take Coricidin BP if needed.   Hemoglobin  Date Value Range Status  05/14/2013 10.5* 12.0 - 15.0 g/dL Final     HCT  Date Value Range Status  05/14/2013 31.7* 36.0 - 46.0 % Final    Physical Exam:  General: alert, cooperative and no distress Lochia: appropriate Uterine Fundus: firm Incision: n/a DVT Evaluation: No evidence of DVT seen on physical exam. Negative Homan's sign. No cords or calf tenderness. No significant calf/ankle edema.  Discharge Diagnoses: Term Pregnancy-delivered  Discharge Information: Date: 05/16/2013 Activity: pelvic rest Diet: routine Medications: PNV, Ibuprofen and Iron Condition: stable Instructions: refer to practice specific booklet Discharge to: home Follow-up Information   Follow up with [facility]. Schedule an appointment as soon as possible for a visit in 6 weeks. (for your postpartum visit)    Contact information:   [address] [place] [address] [phone]      Newborn Data: Live born female  Birth Weight: 6 lb 12.6 oz (3080 g) APGAR: 8, 9  Home with mother. Bottlefeeding, depo for contraception, OP circumcision  Marge Duncans 05/16/2013, 7:55 AM

## 2013-05-17 NOTE — Progress Notes (Signed)
Post discharge chart review completed.  

## 2013-05-19 ENCOUNTER — Ambulatory Visit (INDEPENDENT_AMBULATORY_CARE_PROVIDER_SITE_OTHER): Payer: Medicaid Other | Admitting: *Deleted

## 2013-05-19 VITALS — BP 110/60 | Wt 202.0 lb

## 2013-05-19 DIAGNOSIS — O139 Gestational [pregnancy-induced] hypertension without significant proteinuria, unspecified trimester: Secondary | ICD-10-CM

## 2013-05-19 DIAGNOSIS — O0930 Supervision of pregnancy with insufficient antenatal care, unspecified trimester: Secondary | ICD-10-CM

## 2013-05-19 DIAGNOSIS — Z3049 Encounter for surveillance of other contraceptives: Secondary | ICD-10-CM

## 2013-05-19 MED ORDER — MEDROXYPROGESTERONE ACETATE 150 MG/ML IM SUSP
150.0000 mg | Freq: Once | INTRAMUSCULAR | Status: AC
  Administered 2013-05-19: 150 mg via INTRAMUSCULAR

## 2013-06-23 ENCOUNTER — Encounter: Payer: Self-pay | Admitting: Obstetrics & Gynecology

## 2013-06-23 ENCOUNTER — Ambulatory Visit (INDEPENDENT_AMBULATORY_CARE_PROVIDER_SITE_OTHER): Payer: Medicaid Other | Admitting: Obstetrics & Gynecology

## 2013-06-23 LAB — POCT URINALYSIS DIP (DEVICE)
Nitrite: NEGATIVE
Protein, ur: 30 mg/dL — AB
Specific Gravity, Urine: >=1.03 (ref 1.005–1.030)
Urobilinogen, UA: 0.2 mg/dL (ref 0.0–1.0)
pH: 5.5 (ref 5.0–8.0)

## 2013-06-23 NOTE — Progress Notes (Signed)
Patient states she is having a difficult time eating, cannot keep anything down especially meats.

## 2013-06-23 NOTE — Progress Notes (Signed)
  Subjective:    Patient ID: Tara Wheeler, female    DOB: Aug 18, 1986, 27 y.o.   MRN: [id number]  HPI  27 yo S AA P1 who is here today for her 6 week PP exam. She had a NSVD with no tears with a epidural. She got depo provera on 05-19-13 for contraception. She likes it and wants to continue it. She has not had sex yet. Her son is now 10# and growing well. He is bottlefeeding. She tried breast feeding for a week but the pain was terrible. She has no s/sx of depression. Her breasts have dried up well.   Review of Systems Pap smear nromal 3/14    Objective:   Physical Exam  Normal perineum NSSR, NT, mobile, normal adnexa    Assessment & Plan:  PP exam- normal She wants a note to return to work. RTC 1 year/prn sooner

## 2013-06-24 LAB — URINE CULTURE
Colony Count: NO GROWTH
Organism ID, Bacteria: NO GROWTH

## 2013-08-01 IMAGING — US US OB FOLLOW-UP
1 series · 12 of 28 positions shown · non-contrast
Comparison: none

[Series 1: us ob follow up · 12 of 28 slices shown]
[im 2/28]
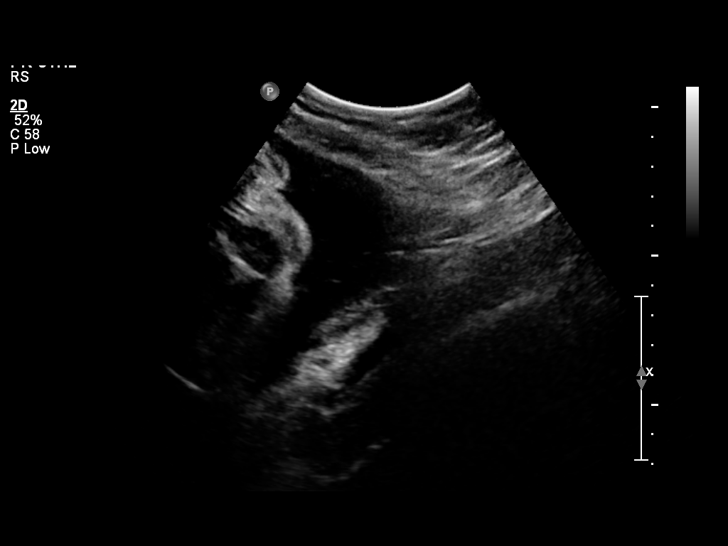
[im 4/28]
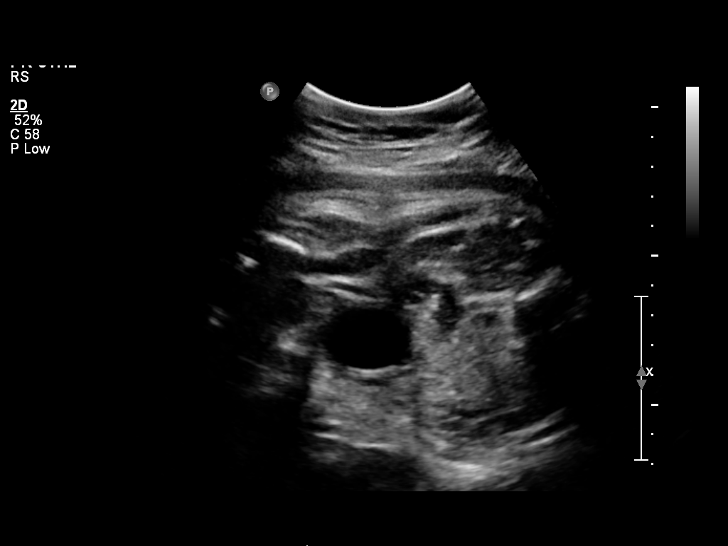
[im 6/28]
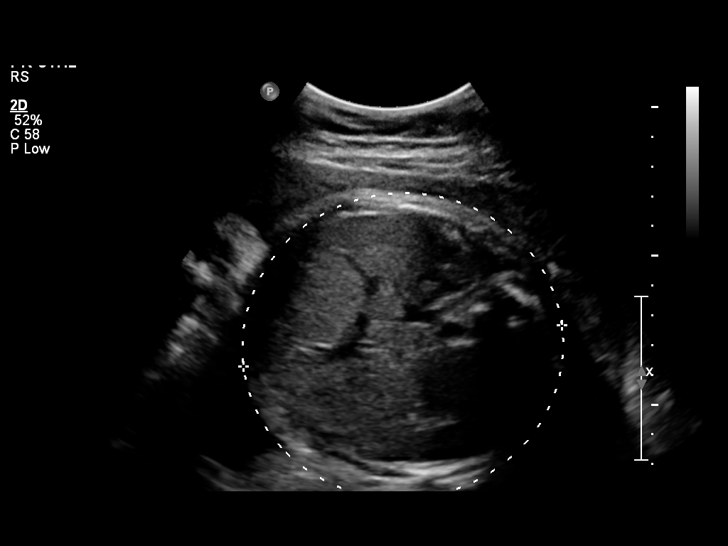
[im 9/28]
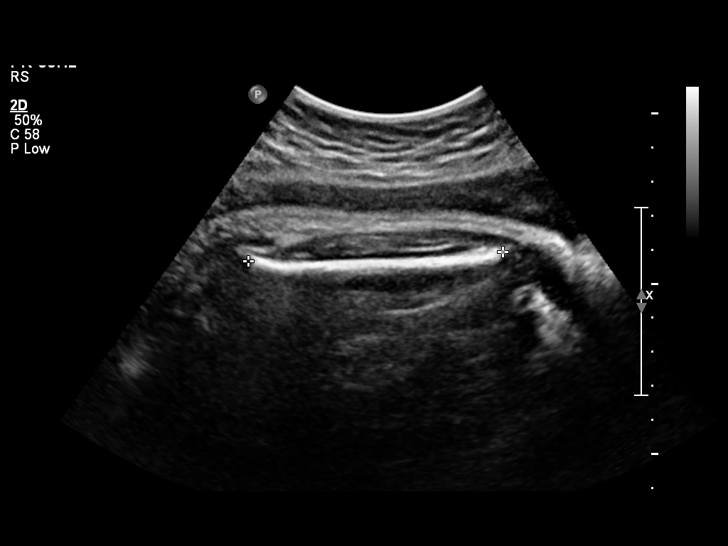
[im 11/28]
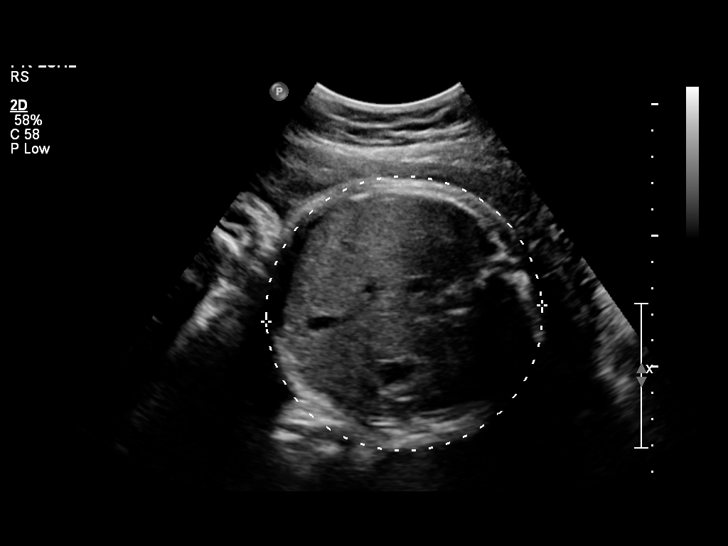
[im 13/28]
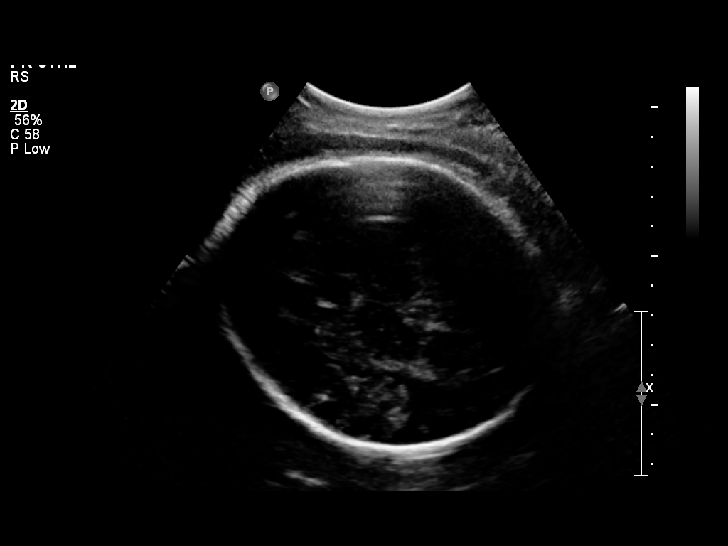
[im 16/28]
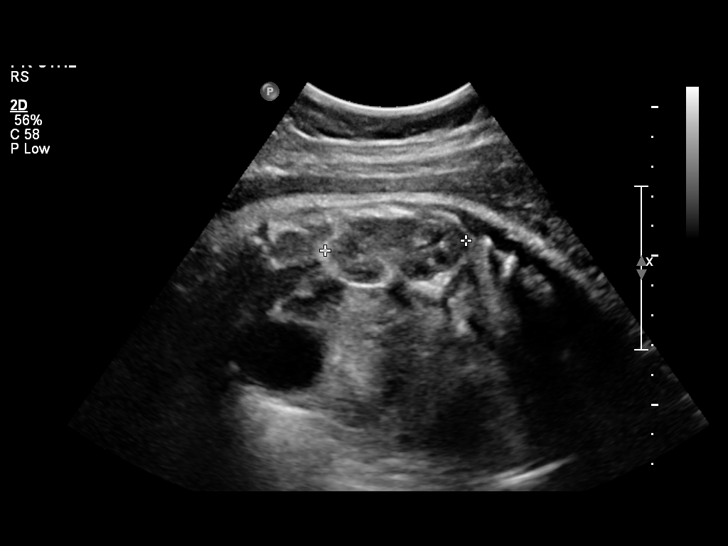
[im 18/28]
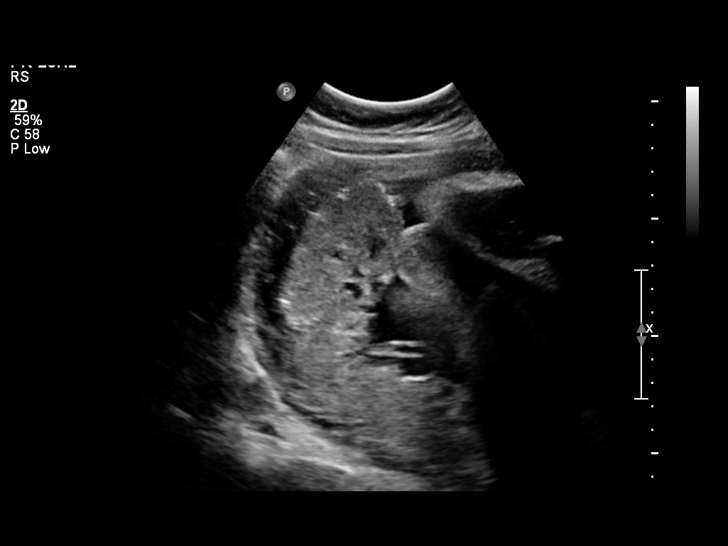
[im 20/28]
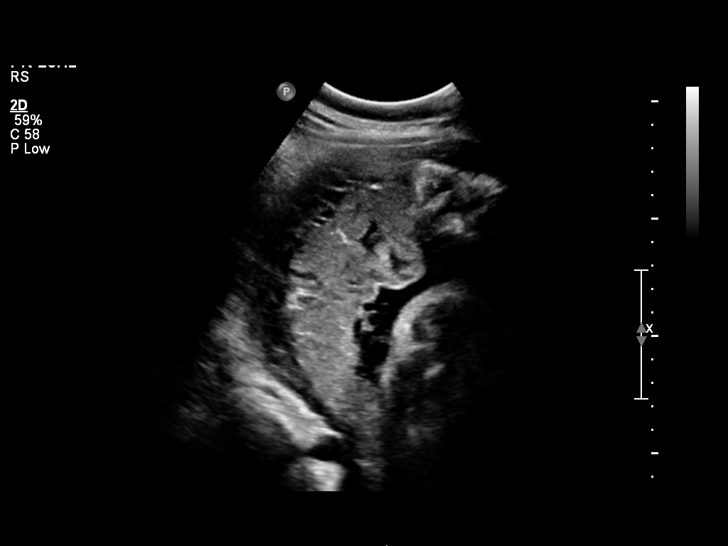
[im 23/28]
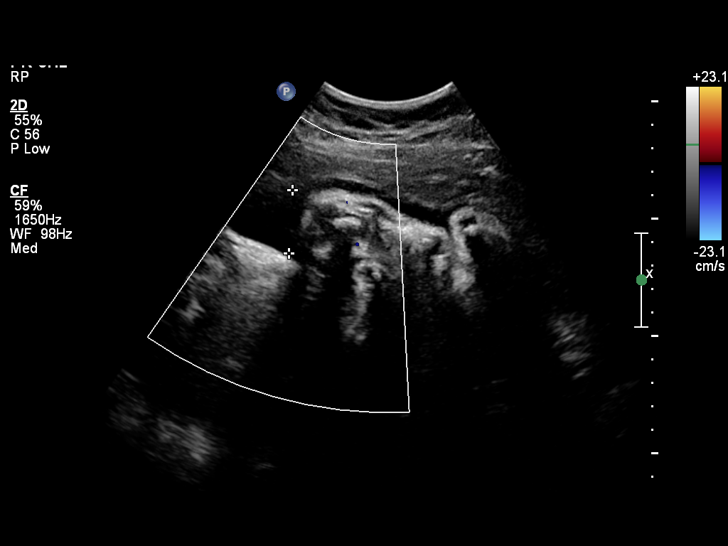
[im 25/28]
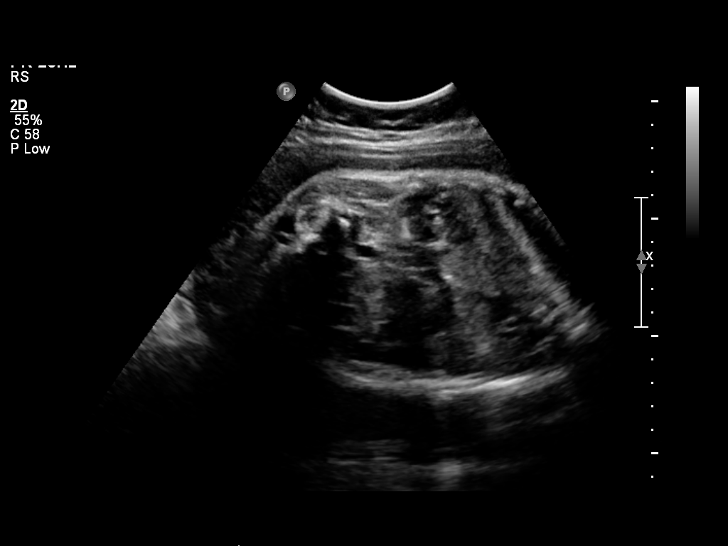
[im 27/28]
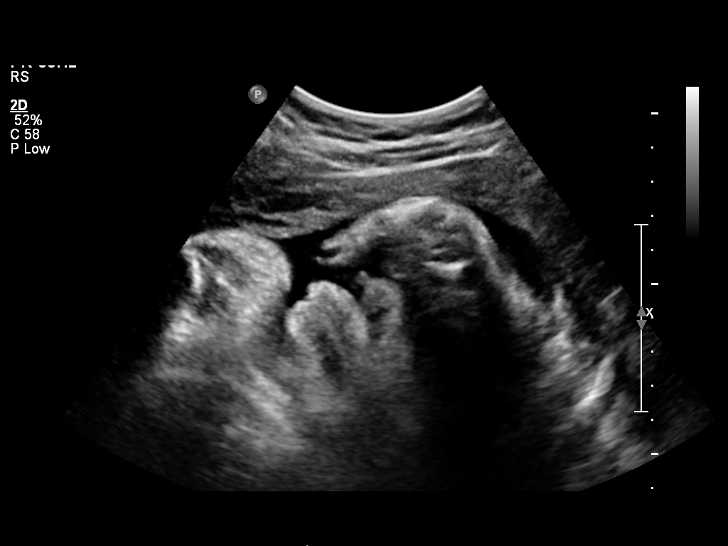

[12 of 28 positions shown; findings below may reference images not displayed]

OBSTETRICS REPORT
                      (Signed Final 05/11/2013 [DATE])

Service(s) Provided

 US OB FOLLOW UP                                       76816.1
Indications

 Previous spontaneous abortion
 Asthma
 Maternal Scoliosis
 Hypertension - Gestational
Fetal Evaluation

 Num Of Fetuses:    1
 Fetal Heart Rate:  152                         bpm
 Cardiac Activity:  Observed
 Presentation:      Cephalic
 Placenta:          Posterior, above cervical
                    os
 P. Cord            Previously Visualized
 Insertion:

 Amniotic Fluid
 AFI FV:      Subjectively low-normal
 AFI Sum:     9.19    cm      21   %Tile     Larg Pckt:   4.29   cm
 RUQ:   4.29   cm    RLQ:    2.69   cm    LLQ:   2.21    cm
Biometry

 BPD:     95.3  mm    G. Age:   38w 6d                CI:        78.02   70 - 86
                                                      FL/HC:      21.6   20.6 -

 HC:     341.4  mm    G. Age:   39w 2d       48  %    HC/AC:      1.05   0.87 -

 AC:       325  mm    G. Age:   36w 3d       11  %    FL/BPD:     77.2   71 - 87
 FL:      73.6  mm    G. Age:   37w 5d       27  %    FL/AC:      22.6   20 - 24

 Est. FW:    9567  gm          7 lb      52  %
Gestational Age

 U/S Today:     38w 1d                                        EDD:   05/24/13
 Best:          38w 6d    Det. By:   Early Ultrasound         EDD:   05/19/13
Anatomy
 Cranium:          Appears normal         Aortic Arch:      Previously seen
 Fetal Cavum:      Previously seen        Ductal Arch:      Previously seen
 Ventricles:       Appears normal         Diaphragm:        Previously seen
 Choroid Plexus:   Previously seen        Stomach:          Appears normal, left
                                                            sided
 Cerebellum:       Previously seen        Abdomen:          Appears normal
 Posterior Fossa:  Previously seen        Abdominal Wall:   Previously seen
 Nuchal Fold:      Not applicable (>20    Cord Vessels:     Previously seen
                   wks GA)
 Face:             Orbits previously      Kidneys:          Appear normal
                   seen
 Lips:             Previously seen        Bladder:          Appears normal
 Heart:            Previously seen        Spine:            Previously seen
 RVOT:             Not well visualized    Lower             Previously seen
                                          Extremities:
 LVOT:             Not well visualized    Upper             Previously seen
                                          Extremities:

 Other:  Male gender. Heels and Right 5th digit previously visualized.
         Technically difficult due to advanced GA.
Cervix Uterus Adnexa

 Cervix:       Not visualized (advanced GA >34 wks)

 Adnexa:     No abnormality visualized.
Impression

 Single intrauterine gestation demonstrating an estimated
 gestational age by ultrasound of 38w 1d. This is correlated
 with expected estimated gestational age by early ultrasound
 of 38w 6d. EFW is currently at the 52%.

 No late developing fetal anatomic abnormalities are noted
 associated with the lateral ventricles, stomach, kidneys or
 bladder. The four chamber heart could not be well assessed
 due to fetal positioning combined with advanced gestational
 age.

 Subjectively and quantitatively low normal amniotic fluid
 volume with an AFI at the 21%.

## 2013-08-06 ENCOUNTER — Ambulatory Visit: Payer: Medicaid Other

## 2014-09-05 ENCOUNTER — Encounter: Payer: Self-pay | Admitting: Obstetrics & Gynecology

## 2017-02-19 ENCOUNTER — Ambulatory Visit (INDEPENDENT_AMBULATORY_CARE_PROVIDER_SITE_OTHER): Payer: Medicaid Other | Admitting: Internal Medicine

## 2017-02-19 ENCOUNTER — Encounter: Payer: Self-pay | Admitting: Internal Medicine

## 2017-02-19 VITALS — BP 138/100 | HR 99 | Temp 98.0°F | Ht 66.0 in | Wt 203.0 lb

## 2017-02-19 DIAGNOSIS — M545 Low back pain, unspecified: Secondary | ICD-10-CM | POA: Insufficient documentation

## 2017-02-19 DIAGNOSIS — G8929 Other chronic pain: Secondary | ICD-10-CM

## 2017-02-19 DIAGNOSIS — R030 Elevated blood-pressure reading, without diagnosis of hypertension: Secondary | ICD-10-CM

## 2017-02-19 DIAGNOSIS — I10 Essential (primary) hypertension: Secondary | ICD-10-CM

## 2017-02-19 DIAGNOSIS — R0789 Other chest pain: Secondary | ICD-10-CM | POA: Diagnosis not present

## 2017-02-19 DIAGNOSIS — H539 Unspecified visual disturbance: Secondary | ICD-10-CM | POA: Diagnosis present

## 2017-02-19 MED ORDER — NORGESTIMATE-ETH ESTRADIOL 0.25-35 MG-MCG PO TABS: 1.0000 | ORAL_TABLET | Freq: Every day | ORAL | 3 refills | Status: DC

## 2017-02-19 MED ORDER — MELOXICAM 15 MG PO TABS: 15.0000 mg | ORAL_TABLET | Freq: Every day | ORAL | 2 refills | Status: DC

## 2017-02-19 NOTE — Assessment & Plan Note (Signed)
Possibly related to scoliosis. Offered physical therapy or sports med referral, but patient would like to see if symptoms improve with Mobic first.  - Mobic  qd PRN - F/u in one month - Can consider sports med or PT referral if no improvement

## 2017-02-19 NOTE — Assessment & Plan Note (Signed)
BP initially 150/116. 130/100 on repeat after sitting in exam room for >15 minutes. Denies prior diagnosis of HTN.  - Provided information regarding DASH diet - F/u in one month. If no improvement can consider medication - Encouraged exercise - BMP today to monitor Cr
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
## 2017-02-19 NOTE — Assessment & Plan Note (Signed)
Now ongoing for >8 months. Previously evaluated at [facility] with no abnormal findings and no referral for further evaluation. As symptoms not improving and reportedly occurring more frequently, will refer to ophtho for further work-up.  - Referral to ophthalmology placed

## 2017-02-19 NOTE — Patient Instructions (Addendum)
It was nice meeting you today Tara Wheeler!  Please begin taking your birth control pills. You can start the Sunday after your next period ends. Be sure to use another form of contraception between now and then if you have sex so you will not get pregnant. Make sure to take the pill every single day around the same time so they will be effective.   I have placed a referral to an ophthalmologist for you. They will call you with the date and time of that appointment.   Please go to the [facility] on [facility] to have your chest xray performed at your earliest convenience. I will let you know if there are any abnormalities.   For your back pain and chest pain, you can begin taking Mobic  (one tablet) once a day.   For your high blood pressure, I have included information regarding a low salt diet below. It will also be important to try to start exercising to help bring your blood pressure down. If your blood pressure is still high at your follow-up appointment, we will consider beginning medication.   I will see you back in one month for a follow-up appointment. If you have any questions or concerns, please feel free to call the clinic.   Be well,  Dr. Natale Milch   DASH Eating Plan DASH stands for "Dietary Approaches to Stop Hypertension." The DASH eating plan is a healthy eating plan that has been shown to reduce high blood pressure (hypertension). It may also reduce your risk for type 2 diabetes, heart disease, and stroke. The DASH eating plan may also help with weight loss. What are tips for following this plan? General guidelines   Avoid eating more than 2,300 mg (milligrams) of salt (sodium) a day. If you have hypertension, you may need to reduce your sodium intake to 1,500 mg a day.  Limit alcohol intake to no more than 1 drink a day for nonpregnant women and 2 drinks a day for men. One drink equals 12 oz of beer, 5 oz of wine, or 1 oz of hard liquor.  Work with  your health care provider to maintain a healthy body weight or to lose weight. Ask what an ideal weight is for you.  Get at least 30 minutes of exercise that causes your heart to beat faster (aerobic exercise) most days of the week. Activities may include walking, swimming, or biking.  Work with your health care provider or diet and nutrition specialist (dietitian) to adjust your eating plan to your individual calorie needs. Reading food labels   Check food labels for the amount of sodium per serving. Choose foods with less than 5 percent of the Daily Value of sodium. Generally, foods with less than 300 mg of sodium per serving fit into this eating plan.  To find whole grains, look for the word "whole" as the first word in the ingredient list. Shopping   Buy products labeled as "low-sodium" or "no salt added."  Buy fresh foods. Avoid canned foods and premade or frozen meals. Cooking   Avoid adding salt when cooking. Use salt-free seasonings or herbs instead of table salt or sea salt. Check with your health care provider or pharmacist before using salt substitutes.  Do not fry foods. Cook foods using healthy methods such as baking, boiling, grilling, and broiling instead.  Cook with heart-healthy oils, such as olive, canola, soybean, or sunflower oil. Meal planning    Eat a balanced diet that includes:  5 or more servings of fruits and vegetables each day. At each meal, try to fill half of your plate with fruits and vegetables.  Up to 6-8 servings of whole grains each day.  Less than 6 oz of lean meat, poultry, or fish each day. A 3-oz serving of meat is about the same size as a deck of cards. One egg equals 1 oz.  2 servings of low-fat dairy each day.  A serving of nuts, seeds, or beans 5 times each week.  Heart-healthy fats. Healthy fats called Omega-3 fatty acids are found in foods such as flaxseeds and coldwater fish, like sardines, salmon, and mackerel.  Limit how much you  eat of the following:  Canned or prepackaged foods.  Food that is high in trans fat, such as fried foods.  Food that is high in saturated fat, such as fatty meat.  Sweets, desserts, sugary drinks, and other foods with added sugar.  Full-fat dairy products.  Do not salt foods before eating.  Try to eat at least 2 vegetarian meals each week.  Eat more home-cooked food and less restaurant, buffet, and fast food.  When eating at a restaurant, ask that your food be prepared with less salt or no salt, if possible. What foods are recommended? The items listed may not be a complete list. Talk with your dietitian about what dietary choices are best for you. Grains  Whole-grain or whole-wheat bread. Whole-grain or whole-wheat pasta. Brown rice. Orpah Cobb. Bulgur. Whole-grain and low-sodium cereals. Pita bread. Low-fat, low-sodium crackers. Whole-wheat flour tortillas. Vegetables  Fresh or frozen vegetables (raw, steamed, roasted, or grilled). Low-sodium or reduced-sodium tomato and vegetable juice. Low-sodium or reduced-sodium tomato sauce and tomato paste. Low-sodium or reduced-sodium canned vegetables. Fruits  All fresh, dried, or frozen fruit. Canned fruit in natural juice (without added sugar). Meat and other protein foods  Skinless chicken or [place]. Ground chicken or [place]. Pork with fat trimmed off. Fish and seafood. Egg whites. Dried beans, peas, or lentils. Unsalted nuts, nut butters, and seeds. Unsalted canned beans. Lean cuts of beef with fat trimmed off. Low-sodium, lean deli meat. Dairy  Low-fat (1%) or fat-free (skim) milk. Fat-free, low-fat, or reduced-fat cheeses. Nonfat, low-sodium ricotta or cottage cheese. Low-fat or nonfat yogurt. Low-fat, low-sodium cheese. Fats and oils  Soft margarine without trans fats. Vegetable oil. Low-fat, reduced-fat, or light mayonnaise and salad dressings (reduced-sodium). Canola, safflower, olive, soybean, and sunflower oils.  Avocado. Seasoning and other foods  Herbs. Spices. Seasoning mixes without salt. Unsalted popcorn and pretzels. Fat-free sweets. What foods are not recommended? The items listed may not be a complete list. Talk with your dietitian about what dietary choices are best for you. Grains  Baked goods made with fat, such as croissants, muffins, or some breads. Dry pasta or rice meal packs. Vegetables  Creamed or fried vegetables. Vegetables in a cheese sauce. Regular canned vegetables (not low-sodium or reduced-sodium). Regular canned tomato sauce and paste (not low-sodium or reduced-sodium). Regular tomato and vegetable juice (not low-sodium or reduced-sodium). Rosita Fire. Olives. Fruits  Canned fruit in a light or heavy syrup. Fried fruit. Fruit in cream or butter sauce. Meat and other protein foods  Fatty cuts of meat. Ribs. Fried meat. Tomasa Blase. Sausage. Bologna and other processed lunch meats. Salami. Fatback. Hotdogs. Bratwurst. Salted nuts and seeds. Canned beans with added salt. Canned or smoked fish. Whole eggs or egg yolks. Chicken or [place] with skin. Dairy  Whole or 2% milk, cream, and half-and-half. Whole or full-fat cream  cheese. Whole-fat or sweetened yogurt. Full-fat cheese. Nondairy creamers. Whipped toppings. Processed cheese and cheese spreads. Fats and oils  Butter. Stick margarine. Lard. Shortening. Ghee. Bacon fat. Tropical oils, such as coconut, palm kernel, or palm oil. Seasoning and other foods  Salted popcorn and pretzels. Onion salt, garlic salt, seasoned salt, table salt, and sea salt. Worcestershire sauce. Tartar sauce. Barbecue sauce. Teriyaki sauce. Soy sauce, including reduced-sodium. Steak sauce. Canned and packaged gravies. Fish sauce. Oyster sauce. Cocktail sauce. Horseradish that you find on the shelf. Ketchup. Mustard. Meat flavorings and tenderizers. Bouillon cubes. Hot sauce and Tabasco sauce. Premade or packaged marinades. Premade or packaged taco seasonings. Relishes.  Regular salad dressings. Where to find more information:  National Heart, Lung, and Blood Institute: PopSteam.is  American Heart Association: www.heart.org Summary  The DASH eating plan is a healthy eating plan that has been shown to reduce high blood pressure (hypertension). It may also reduce your risk for type 2 diabetes, heart disease, and stroke.  With the DASH eating plan, you should limit salt (sodium) intake to 2,300 mg a day. If you have hypertension, you may need to reduce your sodium intake to 1,500 mg a day.  When on the DASH eating plan, aim to eat more fresh fruits and vegetables, whole grains, lean proteins, low-fat dairy, and heart-healthy fats.  Work with your health care provider or diet and nutrition specialist (dietitian) to adjust your eating plan to your individual calorie needs. This information is not intended to replace advice given to you by your health care provider. Make sure you discuss any questions you have with your health care provider. Document Released: 10/10/2011 Document Revised: 10/14/2016 Document Reviewed: 10/14/2016 Elsevier Interactive Patient Education  2017 [employer].

## 2017-02-20 LAB — BASIC METABOLIC PANEL
BUN/Creatinine Ratio: 11 (ref 9–23)
BUN: 9 mg/dL (ref 6–20)
CO2: 24 mmol/L (ref 18–29)
CREATININE: 0.8 mg/dL (ref 0.57–1.00)
Calcium: 8.8 mg/dL (ref 8.7–10.2)
Chloride: 101 mmol/L (ref 96–106)
GFR calc Af Amer: 116 mL/min/{1.73_m2} (ref >59)
GFR, EST NON AFRICAN AMERICAN: 101 mL/min/{1.73_m2} (ref >59)
Glucose: 79 mg/dL (ref 65–99)
Potassium: 4.2 mmol/L (ref 3.5–5.2)
SODIUM: 140 mmol/L (ref 134–144)

## 2017-03-24 ENCOUNTER — Ambulatory Visit: Payer: Medicaid Other | Admitting: Internal Medicine

## 2017-06-03 ENCOUNTER — Ambulatory Visit: Payer: Medicaid Other | Admitting: Internal Medicine

## 2017-06-18 ENCOUNTER — Ambulatory Visit: Payer: Medicaid Other | Admitting: Internal Medicine
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
## 2017-09-04 ENCOUNTER — Encounter: Payer: Self-pay | Admitting: Internal Medicine

## 2017-09-04 ENCOUNTER — Ambulatory Visit (INDEPENDENT_AMBULATORY_CARE_PROVIDER_SITE_OTHER): Payer: Medicaid Other | Admitting: Internal Medicine

## 2017-09-04 VITALS — BP 124/70 | HR 74 | Temp 98.4°F | Ht 66.0 in | Wt 200.0 lb

## 2017-09-04 DIAGNOSIS — G8929 Other chronic pain: Secondary | ICD-10-CM | POA: Diagnosis not present

## 2017-09-04 DIAGNOSIS — R0602 Shortness of breath: Secondary | ICD-10-CM

## 2017-09-04 DIAGNOSIS — M25531 Pain in right wrist: Secondary | ICD-10-CM | POA: Diagnosis not present

## 2017-09-04 DIAGNOSIS — R0789 Other chest pain: Secondary | ICD-10-CM

## 2017-09-04 MED ORDER — IBUPROFEN 600 MG PO TABS: 600.0000 mg | ORAL_TABLET | Freq: Three times a day (TID) | ORAL | 0 refills | Status: DC | PRN

## 2017-09-04 MED ORDER — ALBUTEROL SULFATE HFA 108 (90 BASE) MCG/ACT IN AERS: 2.0000 | INHALATION_SPRAY | Freq: Four times a day (QID) | RESPIRATORY_TRACT | 0 refills | Status: AC | PRN

## 2017-09-04 NOTE — Patient Instructions (Signed)
Make an appointment with Dr. Raymondo BandKoval for lung function test.   Let's try Ibuprofen 600mg  for your pain  I prescribed an albuterol inhaler

## 2017-11-13 ENCOUNTER — Ambulatory Visit: Payer: Medicaid Other | Admitting: Pharmacist

## 2017-11-13 ENCOUNTER — Encounter: Payer: Self-pay | Admitting: Pharmacist

## 2017-11-13 DIAGNOSIS — J4530 Mild persistent asthma, uncomplicated: Secondary | ICD-10-CM

## 2017-11-13 DIAGNOSIS — J45909 Unspecified asthma, uncomplicated: Secondary | ICD-10-CM | POA: Insufficient documentation

## 2017-11-13 MED ORDER — FLUTICASONE PROPIONATE HFA 110 MCG/ACT IN AERO: 2.0000 | INHALATION_SPRAY | Freq: Two times a day (BID) | RESPIRATORY_TRACT | 12 refills | Status: DC
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
## 2017-11-13 NOTE — Patient Instructions (Addendum)
Great to see you today.  Your lung function test results showed an improvement after the albuterol nebulizer.  I am starting you on Flovent HFA (fluticasone) inhaler. Take 2 puffs two times a day. Remember to rinse your mouth out with water and spit after using this inhaler to avoid oral thrush.  Follow up with Dr. Natale MilchLancaster in 1-2 months.

## 2017-11-13 NOTE — Assessment & Plan Note (Signed)
Spirometry evaluation reveals Mild obstructive, reversible lung disease. Post nebulized albuterol tx revealed significant improvement in FEV1 of 18.5% and improvement in FEV1/FVC of 11.3%.  Patient with childhood diagnosis of asthma, currently uncontrolled with nighttime symptoms on no controller inhaler medications at present.  -Start Flovent 110 mcg 2puffs BID.  Educated patient on purpose, proper use, potential adverse effects including Flovent's risk of esophageal candidiasis and need to rinse mouth after each use.   -Continue albuterol inhaler 2puffs q6h PRN for shortness of breath -Reviewed results of pulmonary function tests. Patient verbalized understanding of results and education.

## 2017-11-13 NOTE — Progress Notes (Signed)
Patient ID: Tara Wheeler, female   DOB: August 17, 1986, 32 y.o.   MRN: [id number] Reviewed: I agree with Dr. Macky LowerKoval's documentation and management.

## 2017-12-17 ENCOUNTER — Ambulatory Visit: Payer: Medicaid Other | Admitting: Internal Medicine

## 2019-01-17 ENCOUNTER — Encounter (HOSPITAL_COMMUNITY): Payer: Self-pay

## 2019-01-17 ENCOUNTER — Emergency Department (HOSPITAL_COMMUNITY)
Admission: EM | Admit: 2019-01-17 | Discharge: 2019-01-17 | Disposition: A | Discharge status: Home / Self Care | Payer: Commercial Managed Care - PPO | Attending: Emergency Medicine | Admitting: Emergency Medicine

## 2019-01-17 DIAGNOSIS — Z79899 Other long term (current) drug therapy: Secondary | ICD-10-CM | POA: Diagnosis not present

## 2019-01-17 DIAGNOSIS — J45909 Unspecified asthma, uncomplicated: Secondary | ICD-10-CM | POA: Insufficient documentation

## 2019-01-17 DIAGNOSIS — M25531 Pain in right wrist: Secondary | ICD-10-CM | POA: Diagnosis present

## 2019-01-17 DIAGNOSIS — M659 Synovitis and tenosynovitis, unspecified: Secondary | ICD-10-CM | POA: Diagnosis not present

## 2019-01-17 NOTE — Discharge Instructions (Addendum)
Return if any problems.

## 2019-01-17 NOTE — ED Notes (Signed)
Patient verbalizes understanding of discharge instructions. Opportunity for questioning and answers were provided. Armband removed by staff, pt discharged from ED.  

## 2019-01-17 NOTE — ED Provider Notes (Signed)
[facility] EMERGENCY DEPARTMENT Provider Note   CSN: [id number] Arrival date & time: 01/17/19  1522    History   Chief Complaint Chief Complaint  Patient presents with  . Hand Pain    HPI Tara Wheeler is a 33 y.o. female.     The history is provided by the patient. No language interpreter was used.  Hand Pain  This is a new problem. Episode onset: recurrent  The problem occurs constantly. The problem has been gradually worsening. Nothing aggravates the symptoms. Nothing relieves the symptoms. She has tried nothing for the symptoms. The treatment provided no relief.  Pt reports she has had surgery by Dr. Amanda Pea for tenosynovitis.  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  Pt reports she has begun having pain again.   Past Medical History:  Diagnosis Date  . Asthma   . Miscarriage   . Pregnancy   . Pregnancy induced hypertension   . Scoliosis     Patient Active Problem List   Diagnosis Date Noted  . Asthma 11/13/2017  . Elevated BP without diagnosis of hypertension 02/19/2017  . Low back pain 02/19/2017  . Vision changes 02/19/2017  . Sternal pain 02/19/2017  . Gestational hypertension w/o significant proteinuria in 3rd trimester 05/07/2013  . Gestational proteinuria in third trimester 03/31/2013  . Late prenatal care complicating pregnancy 01/08/2013    Past Surgical History:  Procedure Laterality Date  . NO PAST SURGERIES       OB History    Gravida  2   Para  2   Term  1   Preterm  1   AB      Living  1     SAB      TAB      Ectopic      Multiple      Live Births  1            Home Medications    Prior to Admission medications   Medication Sig Start Date End Date Taking? Authorizing Provider  albuterol (PROVENTIL HFA;VENTOLIN HFA) 108 (90 Base) MCG/ACT inhaler Inhale 2 puffs into the lungs every 6 (six) hours as needed for wheezing or shortness of breath. 09/04/17   Palma Holter, MD  cetirizine (ZYRTEC) 10 MG tablet Take 10 mg by mouth daily.    [provider]  fluticasone (FLOVENT HFA) 110 MCG/ACT inhaler Inhale 2 puffs into the lungs 2 (two) times daily. 11/13/17   Moses Manners, MD  ibuprofen (ADVIL,MOTRIN) 600 MG tablet Take 1 tablet (600 mg total) by mouth every 8 (eight) hours as needed. 09/04/17   Palma Holter, MD  norgestimate-ethinyl estradiol (SPRINTEC 28) 0.25-35 MG-MCG tablet Take 1 tablet by mouth daily. 02/19/17   Marquette Saa, MD    Family History Family History  Problem Relation Age of Onset  . Hypertension Mother   . Hypertension Father   . Diabetes Maternal Aunt     Social History Social History   Tobacco Use  . Smoking status: Never Smoker  . Smokeless tobacco: Never Used  Substance Use Topics  . Alcohol use: No  . Drug use: No     Allergies   Patient has no known allergies.   Review of Systems Review of Systems  All other systems reviewed and are negative.    Physical Exam Updated Vital Signs BP (!) 141/84 (BP Location: Left Arm)   Pulse 82   Temp 98.6  F (37 C) (Oral)   Resp 16   SpO2 100%   Physical Exam Vitals signs and nursing note reviewed.  Constitutional:      Appearance: She is well-developed.  HENT:     Head: Normocephalic.     Right Ear: Tympanic membrane normal.     Left Ear: Tympanic membrane normal.  Cardiovascular:     Rate and Rhythm: Normal rate.  Pulmonary:     Effort: Pulmonary effort is normal.  Abdominal:     General: There is no distension.  Musculoskeletal: Normal range of motion.        General: No swelling, tenderness or deformity.     Comments: Tender right wrist, pain with movement,  nv and ns intact   Skin:    General: Skin is warm.  Neurological:     General: No focal deficit present.     Mental Status: She is alert and oriented to person, place, and time.  Psychiatric:        Mood and Affect: Mood normal.      ED Treatments /  Results  Labs (all labs ordered are listed, but only abnormal results are displayed) Labs Reviewed - No data to display  EKG None  Radiology No results found.  Procedures Procedures (including critical care time)  Medications Ordered in ED Medications - No data to display   Initial Impression / Assessment and Plan / ED Course  I have reviewed the triage vital signs and the nursing notes.  Pertinent labs & imaging results that were available during my care of the patient were reviewed by me and considered in my medical decision making (see chart for details).        MDM   Pt advised to wear her splint Ibuprofen for 48 hours Follow up with Dr. Amanda Pea if symptoms persist Out ot work x 3 days   Final Clinical Impressions(s) / ED Diagnoses   Final diagnoses:  Tenosynovitis    ED Discharge Orders    None    An After Visit Summary was printed and given to the patient.    Elson Areas, [place] 01/17/19 1849    Arby Barrette, MD 01/21/19 [phone]

## 2019-01-17 NOTE — ED Triage Notes (Signed)
Pt presents with right hand swelling and pain after working on Wednesday and Thursday. 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 Pt states the repetitive motion has aggravated her hand, which she already and surgery on x2 years ago. Pt has limited ROM to her hand has a result of the pain.

## 2019-01-20 ENCOUNTER — Emergency Department (HOSPITAL_COMMUNITY)
Admission: EM | Admit: 2019-01-20 | Discharge: 2019-01-20 | Disposition: A | Discharge status: Home / Self Care | Payer: Commercial Managed Care - PPO | Attending: Emergency Medicine | Admitting: Emergency Medicine

## 2019-01-20 ENCOUNTER — Other Ambulatory Visit: Payer: Self-pay

## 2019-01-20 ENCOUNTER — Encounter (HOSPITAL_COMMUNITY): Payer: Self-pay

## 2019-01-20 DIAGNOSIS — M79644 Pain in right finger(s): Secondary | ICD-10-CM | POA: Diagnosis present

## 2019-01-20 DIAGNOSIS — M659 Synovitis and tenosynovitis, unspecified: Secondary | ICD-10-CM | POA: Insufficient documentation

## 2019-01-20 MED ORDER — TRAMADOL HCL 50 MG PO TABS: 50.0000 mg | ORAL_TABLET | Freq: Four times a day (QID) | ORAL | 0 refills | Status: DC | PRN

## 2019-01-20 MED ORDER — MELOXICAM 15 MG PO TABS: 15.0000 mg | ORAL_TABLET | Freq: Every day | ORAL | 0 refills | Status: DC

## 2019-01-20 NOTE — ED Triage Notes (Signed)
Pt arrives POV for eval of R hand pain for which she was seen on 3/15 for. 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 Pt reports she has a hx of surgery to R hand, has appt w/ Grammig on Monday, but states pain is more severe and she needs more time off work and something for pain control.

## 2019-01-20 NOTE — ED Provider Notes (Signed)
[facility] EMERGENCY DEPARTMENT Provider Note   CSN: [id number] Arrival date & time: 01/20/19  1719    History   Chief Complaint Chief Complaint  Patient presents with   Hand Pain    HPI Tara Wheeler is a 33 y.o. female who presents to the ED for cc of right thumb pain.  She has a prior previous history of de Quervain's tenosynovitis of the right hand.  She has had surgery on the same forearm.  She has had several days of gradually worsening pain which is aggravated by movement of the thumb, better with rest.  She has an appointment this upcoming Monday with Dr. Amanda Pea.  She was seen previously on 01/17/2019 but her work note has run out and HR told her to return for further work note.  She has been taking ibuprofen over-the-counter which not been helping her pain.  She needs further time out of work until she can see her hand specialist on Monday because she works at a distribution center lifting heavy boxes.  She denies any new injuries.     HPI  Past Medical History:  Diagnosis Date   Asthma    Miscarriage    Pregnancy    Pregnancy induced hypertension    Scoliosis     Patient Active Problem List   Diagnosis Date Noted   Asthma 11/13/2017   Elevated BP without diagnosis of hypertension 02/19/2017   Low back pain 02/19/2017   Vision changes 02/19/2017   Sternal pain 02/19/2017   Gestational hypertension w/o significant proteinuria in 3rd trimester 05/07/2013   Gestational proteinuria in third trimester 03/31/2013   Late prenatal care complicating pregnancy 01/08/2013    Past Surgical History:  Procedure Laterality Date   NO PAST SURGERIES       OB History    Gravida  2   Para  2   Term  1   Preterm  1   AB      Living  1     SAB      TAB      Ectopic      Multiple      Live Births  1            Home Medications    Prior to Admission medications   Medication Sig Start Date End Date Taking?  Authorizing Provider  albuterol (PROVENTIL HFA;VENTOLIN HFA) 108 (90 Base) MCG/ACT inhaler Inhale 2 puffs into the lungs every 6 (six) hours as needed for wheezing or shortness of breath. 09/04/17  Yes Palma Holter, MD  ibuprofen (ADVIL,MOTRIN) 600 MG tablet Take 1 tablet (600 mg total) by mouth every 8 (eight) hours as needed. Patient taking differently: Take 600 mg by mouth every 8 (eight) hours as needed for mild pain.  09/04/17  Yes Palma Holter, MD  fluticasone (FLOVENT HFA) 110 MCG/ACT inhaler Inhale 2 puffs into the lungs 2 (two) times daily. Patient not taking: Reported on 01/20/2019 11/13/17   Moses Manners, MD  norgestimate-ethinyl estradiol (SPRINTEC 28) 0.25-35 MG-MCG tablet Take 1 tablet by mouth daily. Patient not taking: Reported on 01/20/2019 02/19/17   Marquette Saa, MD    Family History Family History  Problem Relation Age of Onset   Hypertension Mother    Hypertension Father    Diabetes Maternal Aunt     Social History Social History   Tobacco Use   Smoking status: Never Smoker   Smokeless tobacco: Never Used  Substance Use Topics  Alcohol use: No   Drug use: No     Allergies   Patient has no known allergies.   Review of Systems Review of Systems Ten systems reviewed and are negative for acute change, except as noted in the HPI.    Physical Exam Updated Vital Signs BP (!) 144/97 (BP Location: Left Arm)    Pulse 92    Temp 98.3 F (36.8 C) (Oral)    Resp 16    Ht 5\' 6"  (1.676 m)    Wt 83 kg    SpO2 100%    BMI 29.53 kg/m   Physical Exam Vitals signs and nursing note reviewed.  Constitutional:      General: She is not in acute distress.    Appearance: She is well-developed. She is not diaphoretic.  HENT:     Head: Normocephalic and atraumatic.  Eyes:     General: No scleral icterus.    Conjunctiva/sclera: Conjunctivae normal.  Neck:     Musculoskeletal: Normal range of motion.  Cardiovascular:     Rate and  Rhythm: Normal rate and regular rhythm.     Heart sounds: Normal heart sounds. No murmur. No friction rub. No gallop.   Pulmonary:     Effort: Pulmonary effort is normal. No respiratory distress.     Breath sounds: Normal breath sounds.  Abdominal:     General: Bowel sounds are normal. There is no distension.     Palpations: Abdomen is soft. There is no mass.     Tenderness: There is no abdominal tenderness. There is no guarding.  Musculoskeletal:        General: Swelling and tenderness present.     Comments: Tenderness and swelling noted in the right thenar eminence, tenderness along the extensor pollicis and up the right forearm.  No heat or redness.  Tenderness with any movement of the thumb.  Skin:    General: Skin is warm and dry.  Neurological:     Mental Status: She is alert and oriented to person, place, and time.  Psychiatric:        Behavior: Behavior normal.      ED Treatments / Results  Labs (all labs ordered are listed, but only abnormal results are displayed) Labs Reviewed - No data to display  EKG None  Radiology No results found.  Procedures Procedures (including critical care time)  Medications Ordered in ED Medications - No data to display   Initial Impression / Assessment and Plan / ED Course  I have reviewed the triage vital signs and the nursing notes.  Pertinent labs & imaging results that were available during my care of the patient were reviewed by me and considered in my medical decision making (see chart for details).        With known tenosynovitis.  Patient will be given at prescription for tramadol and Mobic.  Continue to ice and follow-up with Dr. Amanda Pea on Monday.  Discussed return precautions.PDMP reviewed during this encounter.   Final Clinical Impressions(s) / ED Diagnoses   Final diagnoses:  None    ED Discharge Orders    None       Arthor Captain, PA-C 01/20/19 2242    Derwood Kaplan, MD 01/24/19 [phone]

## 2019-01-20 NOTE — ED Notes (Signed)
Patient verbalizes understanding of discharge instructions. Opportunity for questioning and answers were provided. Armband removed by staff, pt discharged from ED ambulatory by self\  

## 2019-01-20 NOTE — Discharge Instructions (Signed)
Contact a health care provider if: Your symptoms are not improving or are getting worse. You have a fever and more of any of the following symptoms: Pain. Redness. Warmth. Swelling.

## 2019-08-22 NOTE — Progress Notes (Signed)
[name] [address] [place], [place] [address] Phone: [phone] Subjective:   [name], am serving as a scribe for Dr. Antoine Primas.  I'm seeing this patient by the request  of:  Myrene Buddy, MD   CC: low back pain   [id number]  Tara Wheeler is a 33 y.o. female coming in with complaint of back and neck pain. Patient did do repetitive work and pain started in March. 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 Is having migraines since March. No history of migraines. States that she got whiplash at her job.   Also said that her wrist is bothering her. 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 Has entire hand swelling with use.   Notices that her entire body hurts with weather changes. Feels that her traps are swollen. Uses CBD oil and migraine relief from [employer]. Has tried meloxicam and did not care for medication.    Patient did have x-rays done in 2009.  These were independently visualized by me.  Patient was found to have some scoliosis mostly of the lumbar region convex right but everything appeared to be normal otherwise.  Past Medical History:  Diagnosis Date  . Asthma   . Miscarriage   . Pregnancy   . Pregnancy induced hypertension   . Scoliosis    Past Surgical History:  Procedure Laterality Date  . NO PAST SURGERIES     Social History   Socioeconomic History  . Marital status: Single    Spouse name: Not on file  . Number of children: Not on file  . Years of education: Not on file  . Highest education level: Not on file  Occupational History  . Not on file  Social Needs  . Financial resource strain: Not on file  . Food insecurity    Worry: Not on file    Inability: Not on file  . Transportation needs    Medical: Not on file    Non-medical: Not on file  Tobacco Use  . Smoking status: Never Smoker  . Smokeless tobacco: Never Used   Substance and Sexual Activity  . Alcohol use: No  . Drug use: No  . Sexual activity: Yes  Lifestyle  . Physical activity    Days per week: Not on file    Minutes per session: Not on file  . Stress: Not on file  Relationships  . Social Musician on phone: Not on file    Gets together: Not on file    Attends religious service: Not on file    Active member of club or organization: Not on file    Attends meetings of clubs or organizations: Not on file    Relationship status: Not on file  Other Topics Concern  . Not on file  Social History Narrative  . Not on file   No Known Allergies Family History  Problem Relation Age of Onset  . Hypertension Mother   . Hypertension Father   . Diabetes Maternal Aunt     Current Outpatient Medications (Endocrine & Metabolic):  .  norgestimate-ethinyl estradiol (SPRINTEC 28) 0.25-35 MG-MCG tablet, Take 1 tablet by mouth daily.   Current Outpatient Medications (Respiratory):  .  albuterol (PROVENTIL HFA;VENTOLIN HFA) 108 (90 Base) MCG/ACT inhaler, Inhale 2 puffs into the lungs every 6 (six) hours as needed for wheezing or shortness of breath. Marland Kitchen  fluticasone (FLOVENT HFA) 110 MCG/ACT inhaler, Inhale 2 puffs into the lungs 2 (two) times daily.  Current Outpatient Medications (Analgesics):  .  ibuprofen (ADVIL,MOTRIN) 600 MG tablet, Take 1 tablet (600 mg total) by mouth every 8 (eight) hours as needed. (Patient taking differently: Take 600 mg by mouth every 8 (eight) hours as needed for mild pain. )      Past medical history, social, surgical and family history all reviewed in electronic medical record.  No pertanent information unless stated regarding to the chief complaint.   Review of Systems:  No headache, visual changes, nausea, vomiting, diarrhea, constipation, dizziness, abdominal pain, skin rash, fevers, chills, night sweats, weight loss, swollen lymph nodes,  chest pain, shortness of breath, mood changes.  Positive muscle  aches, body aches  Objective  Blood pressure 112/68, height 5\' 6"  (1.676 m), weight 216 lb (98 kg).    General: No apparent distress alert and oriented x3 mood and affect normal, dressed appropriately.  HEENT: Pupils equal, extraocular movements intact fullness of the thyroid noted Respiratory: Patient's speak in full sentences and does not appear short of breath  Cardiovascular: No lower extremity edema, non tender, no erythema  Skin: Warm dry intact with no signs of infection or rash on extremities or on axial skeleton.  Abdomen: Soft nontender  Neuro: Cranial nerves II through XII are intact, neurovascularly intact in all extremities with 2+ DTRs and 2+ pulses.  Lymph: No lymphadenopathy of posterior or anterior cervical chain or axillae bilaterally.  Gait normal with good balance and coordination.  MSK:  Non tender with full range of motion and good stability and symmetric strength and tone of shoulders, elbows, hip, knee and ankles bilaterally.  No wrist pain is noted diffusely.  Patient has a scar from the area surgery.  Patient does have scar tissue formation in that area that is slightly tender.  Does have good range of motion and negative grind test.  Neck has significant tightness.  Patient does have hypertrophy noted of the trapezius bilaterally it appears.  Does have fullness but no true mass appreciated.  Significant tightness of the neck noted.  Patient's pain is out of proportion to the amount of palpation   Back Exam:  Inspection: Loss of lordosis Motion: Flexion 45 deg, Extension 25 deg, Side Bending to 35 deg bilaterally,  Rotation to 35 deg bilaterally  SLR laying: Negative  XSLR laying: Negative  Palpable tenderness: Tender to palpation in the paraspinal musculature lumbar spine diffusely from the parascapular region down to the sacral area bilaterally.Marland Kitchen FABER: negative. Sensory change: Gross sensation intact to all lumbar and sacral dermatomes.  Reflexes: 2+ at both  patellar tendons, 2+ at achilles tendons, Babinski's downgoing.  Strength at foot  Plantar-flexion: 5/5 Dorsi-flexion: 5/5 Eversion: 5/5 Inversion: 5/5  Leg strength  Quad: 5/5 Hamstring: 5/5 Hip flexor: 5/5 Hip abductors: 5/5    Limited musculoskeletal ultrasound was performed and interpreted by Lyndal Pulley  Limited ultrasound of patient's right wrist shows that patient has some scar tissue formation noted in the area of the surgery that likely was more of an extensor carpi ulnaris release.  Difficult to assess.  Otherwise no significant abnormality noted. Impression: Scar tissue formation   Impression and Recommendations:     This case required medical decision making of moderate complexity. The above documentation has been reviewed and is accurate and complete Lyndal Pulley, DO       Note: This dictation was prepared with Dragon dictation along with smaller  Company secretary. Any transcriptional errors that result from this process are unintentional.

## 2019-08-23 ENCOUNTER — Ambulatory Visit: Payer: No Typology Code available for payment source | Admitting: Family Medicine

## 2019-08-23 ENCOUNTER — Other Ambulatory Visit: Payer: Self-pay

## 2019-08-23 ENCOUNTER — Encounter: Payer: Self-pay | Admitting: Family Medicine

## 2019-08-23 ENCOUNTER — Ambulatory Visit: Payer: Self-pay

## 2019-08-23 ENCOUNTER — Other Ambulatory Visit (INDEPENDENT_AMBULATORY_CARE_PROVIDER_SITE_OTHER): Payer: No Typology Code available for payment source

## 2019-08-23 VITALS — BP 112/68 | Ht 66.0 in | Wt 216.0 lb

## 2019-08-23 DIAGNOSIS — M25531 Pain in right wrist: Secondary | ICD-10-CM | POA: Insufficient documentation

## 2019-08-23 DIAGNOSIS — G8929 Other chronic pain: Secondary | ICD-10-CM

## 2019-08-23 DIAGNOSIS — M545 Low back pain: Secondary | ICD-10-CM

## 2019-08-23 DIAGNOSIS — M2550 Pain in unspecified joint: Secondary | ICD-10-CM

## 2019-08-23 LAB — COMPREHENSIVE METABOLIC PANEL
ALT: 18 U/L (ref 0–35)
AST: 13 U/L (ref 0–37)
Albumin: 4.3 g/dL (ref 3.5–5.2)
Alkaline Phosphatase: 101 U/L (ref 39–117)
BUN: 9 mg/dL (ref 6–23)
CO2: 25 mEq/L (ref 19–32)
Calcium: 9.2 mg/dL (ref 8.4–10.5)
Chloride: 105 mEq/L (ref 96–112)
Creatinine, Ser: 0.93 mg/dL (ref 0.40–1.20)
GFR: 84.9 mL/min (ref >60.00)
Glucose, Bld: 114 mg/dL — ABNORMAL HIGH (ref 70–99)
Potassium: 3.4 mEq/L — ABNORMAL LOW (ref 3.5–5.1)
Sodium: 140 mEq/L (ref 135–145)
Total Bilirubin: 0.2 mg/dL (ref 0.2–1.2)
Total Protein: 7.7 g/dL (ref 6.0–8.3)

## 2019-08-23 LAB — CBC WITH DIFFERENTIAL/PLATELET
Basophils Absolute: 0.1 10*3/uL (ref 0.0–0.1)
Basophils Relative: 1.1 % (ref 0.0–3.0)
Eosinophils Absolute: 0.1 10*3/uL (ref 0.0–0.7)
Eosinophils Relative: 0.8 % (ref 0.0–5.0)
HCT: 36.7 % (ref 36.0–46.0)
Hemoglobin: 11.7 g/dL — ABNORMAL LOW (ref 12.0–15.0)
Lymphocytes Relative: 32.1 % (ref 12.0–46.0)
Lymphs Abs: 2.9 10*3/uL (ref 0.7–4.0)
MCHC: 31.9 g/dL (ref 30.0–36.0)
MCV: 83.9 fl (ref 78.0–100.0)
Monocytes Absolute: 0.7 10*3/uL (ref 0.1–1.0)
Monocytes Relative: 8.1 % (ref 3.0–12.0)
Neutro Abs: 5.3 10*3/uL (ref 1.4–7.7)
Neutrophils Relative %: 57.9 % (ref 43.0–77.0)
Platelets: 415 10*3/uL — ABNORMAL HIGH (ref 150.0–400.0)
RBC: 4.38 Mil/uL (ref 3.87–5.11)
RDW: 19.3 % — ABNORMAL HIGH (ref 11.5–15.5)
WBC: 9.1 10*3/uL (ref 4.0–10.5)

## 2019-08-23 LAB — SEDIMENTATION RATE: Sed Rate: 85 mm/hr — ABNORMAL HIGH (ref 0–20)

## 2019-08-23 LAB — T3, FREE: T3, Free: 3.6 pg/mL (ref 2.3–4.2)

## 2019-08-23 LAB — URIC ACID: Uric Acid, Serum: 5.7 mg/dL (ref 2.4–7.0)

## 2019-08-23 LAB — FERRITIN: Ferritin: 7.6 ng/mL — ABNORMAL LOW (ref 10.0–291.0)

## 2019-08-23 LAB — IBC PANEL
Iron: 65 ug/dL (ref 42–145)
Saturation Ratios: 13.8 % — ABNORMAL LOW (ref 20.0–50.0)
Transferrin: 337 mg/dL (ref 212.0–360.0)

## 2019-08-23 LAB — TSH: TSH: 0.73 u[IU]/mL (ref 0.35–4.50)

## 2019-08-23 LAB — T4, FREE: Free T4: 0.81 ng/dL (ref 0.60–1.60)

## 2019-08-23 LAB — TESTOSTERONE: Testosterone: 46.9 ng/dL — ABNORMAL HIGH (ref 15.00–40.00)

## 2019-08-23 LAB — C-REACTIVE PROTEIN: CRP: <1 mg/dL (ref 0.5–20.0)

## 2019-08-23 LAB — VITAMIN D 25 HYDROXY (VIT D DEFICIENCY, FRACTURES): VITD: 14.38 ng/mL — ABNORMAL LOW (ref 30.00–100.00)

## 2019-08-23 MED ORDER — KETOROLAC TROMETHAMINE 60 MG/2ML IM SOLN
60.0000 mg | Freq: Once | INTRAMUSCULAR | Status: AC
  Administered 2019-08-23: 17:00:00 60 mg via INTRAMUSCULAR

## 2019-08-23 MED ORDER — VITAMIN D (ERGOCALCIFEROL) 1.25 MG (50000 UNIT) PO CAPS: 50000.0000 [IU] | ORAL_CAPSULE | ORAL | 0 refills | Status: DC

## 2019-08-23 MED ORDER — METHYLPREDNISOLONE ACETATE 80 MG/ML IJ SUSP
80.0000 mg | Freq: Once | INTRAMUSCULAR | Status: AC
  Administered 2019-08-23: 17:00:00 80 mg via INTRAMUSCULAR

## 2019-08-23 NOTE — Assessment & Plan Note (Signed)
Do not have notes of previous surgery.  Patient is unable to tell me exactly what it was from.  Forearm incision is approximately 4 cm proximal to the wrist itself.  Scar tissue formation noted and we discussed massage as well as topical anti-inflammatories to see if this will help.  Otherwise nothing found significantly on exam.

## 2019-08-23 NOTE — Assessment & Plan Note (Signed)
Polyarthralgia.  Discussed with patient in great length.  Pain seems to be migratory.  Patient has not had any significant work-up previously.  Will rule out such things as iron deficiency, vitamin D deficiency, and other reasons why patient is having increasing discomfort and pain.  Patient given meloxicam that I think will be beneficial as well.  I do not believe the patient's aches and pains are secondary from a work injury but does have more systemic findings that is more consistent define what is going on.  Depending on work-up this could change medical management.  Patient will following up with me again in 3 to 4 weeks

## 2019-08-23 NOTE — Patient Instructions (Addendum)
Labs downstairs Gabapentin 200mg  Arnica lotion for scar See me in 6 weeks

## 2019-08-23 NOTE — Assessment & Plan Note (Signed)
Multifactorial, discussed weight, core strengthening and stability.  Do believe there is something causing most of the discomfort and pain.

## 2019-08-24 ENCOUNTER — Encounter: Payer: Self-pay | Admitting: *Deleted

## 2019-08-25 LAB — CYCLIC CITRUL PEPTIDE ANTIBODY, IGG: Cyclic Citrullin Peptide Ab: <16 UNITS

## 2019-08-25 LAB — RHEUMATOID FACTOR: Rheumatoid fact SerPl-aCnc: <14 IU/mL (ref <14)

## 2019-08-25 LAB — PTH, INTACT AND CALCIUM
Calcium: 9.3 mg/dL (ref 8.6–10.2)
PTH: 47 pg/mL (ref 14–64)

## 2019-08-25 LAB — CALCIUM, IONIZED: Calcium, Ion: 4.97 mg/dL (ref 4.8–5.6)

## 2019-08-25 LAB — ANGIOTENSIN CONVERTING ENZYME: Angiotensin-Converting Enzyme: 29 U/L (ref 9–67)

## 2019-08-25 LAB — ANA: Anti Nuclear Antibody (ANA): NEGATIVE

## 2019-09-20 NOTE — Progress Notes (Signed)
[facility] [place], [place] [address] Phone: [phone] Subjective:   I Kandace Blitz am serving as a Education administrator for Dr. Hulan Saas.  I'm seeing this patient by the request  of:    CC: Low back pain follow-up  [id number]   08/23/2019 Do not have notes of previous surgery.  Patient is unable to tell me exactly what it was from.  Forearm incision is approximately 4 cm proximal to the wrist itself.  Scar tissue formation noted and we discussed massage as well as topical anti-inflammatories to see if this will help.  Otherwise nothing found significantly on exam.  09/21/2019 Tara Wheeler is a 33 y.o. female coming in with complaint of polyarthralgia pain. Patient states that she has been experiencing headaches and back pain. Was prescribed Cyclobenzaprine. Was referred to neurology for the migraine headaches. Mid to lower back pain. Was hurt on the job back in March. States she doesn't have time or money for rehab. Recently started a new job.  Patient did have laboratory work-up that did show that patient had severe iron deficiency, low vitamin D and significant elevation in sedimentation rate but autoimmune labs were unremarkable.     Past Medical History:  Diagnosis Date  . Asthma   . Miscarriage   . Pregnancy   . Pregnancy induced hypertension   . Scoliosis    Past Surgical History:  Procedure Laterality Date  . NO PAST SURGERIES     Social History   Socioeconomic History  . Marital status: Single    Spouse name: Not on file  . Number of children: Not on file  . Years of education: Not on file  . Highest education level: Not on file  Occupational History  . Not on file  Social Needs  . Financial resource strain: Not on file  . Food insecurity    Worry: Not on file    Inability: Not on file  . Transportation needs    Medical: Not on file    Non-medical: Not on file  Tobacco Use  . Smoking status: Never Smoker  .  Smokeless tobacco: Never Used  Substance and Sexual Activity  . Alcohol use: No  . Drug use: No  . Sexual activity: Yes  Lifestyle  . Physical activity    Days per week: Not on file    Minutes per session: Not on file  . Stress: Not on file  Relationships  . Social Herbalist on phone: Not on file    Gets together: Not on file    Attends religious service: Not on file    Active member of club or organization: Not on file    Attends meetings of clubs or organizations: Not on file    Relationship status: Not on file  Other Topics Concern  . Not on file  Social History Narrative  . Not on file   No Known Allergies Family History  Problem Relation Age of Onset  . Hypertension Mother   . Hypertension Father   . Diabetes Maternal Aunt     Current Outpatient Medications (Endocrine & Metabolic):  .  norgestimate-ethinyl estradiol (SPRINTEC 28) 0.25-35 MG-MCG tablet, Take 1 tablet by mouth daily.   Current Outpatient Medications (Respiratory):  .  albuterol (PROVENTIL HFA;VENTOLIN HFA) 108 (90 Base) MCG/ACT inhaler, Inhale 2 puffs into the lungs every 6 (six) hours as needed for wheezing or shortness of breath. .  fluticasone (FLOVENT HFA) 110 MCG/ACT inhaler,  Inhale 2 puffs into the lungs 2 (two) times daily.  Current Outpatient Medications (Analgesics):  .  ibuprofen (ADVIL,MOTRIN) 600 MG tablet, Take 1 tablet (600 mg total) by mouth every 8 (eight) hours as needed. (Patient taking differently: Take 600 mg by mouth every 8 (eight) hours as needed for mild pain. ) .  meloxicam (MOBIC) 7.5 MG tablet, Take 1 tablet (7.5 mg total) by mouth daily.   Current Outpatient Medications (Other):  Marland Kitchen  Vitamin D, Ergocalciferol, (DRISDOL) 1.25 MG (50000 UT) CAPS capsule, Take 1 capsule (50,000 Units total) by mouth every 7 (seven) days. .  cyclobenzaprine (FLEXERIL) 10 MG tablet, Take 1 tablet (10 mg total) by mouth 3 (three) times daily as needed for muscle spasms.    Past  medical history, social, surgical and family history all reviewed in electronic medical record.  No pertanent information unless stated regarding to the chief complaint.   Review of Systems:  No headache, visual changes, nausea, vomiting, diarrhea, constipation, dizziness, abdominal pain, skin rash, fevers, chills, night sweats, weight loss, swollen lymph nodes, , chest pain, shortness of breath, mood changes.  Positive muscle aches, body aches, joint swelling  Objective  Blood pressure 130/80, pulse 92, height 5\' 6"  (1.676 m), weight 205 lb (93 kg), SpO2 96 %.    General: No apparent distress alert and oriented x3 mood and affect normal, dressed appropriately.  HEENT: Pupils equal, extraocular movements intact  Respiratory: Patient's speak in full sentences and does not appear short of breath  Cardiovascular: No lower extremity edema, non tender, no erythema  Skin: Warm dry intact with no signs of infection or rash on extremities or on axial skeleton.  Abdomen: Soft nontender  Neuro: Cranial nerves II through XII are intact, neurovascularly intact in all extremities with 2+ DTRs and 2+ pulses.  Lymph: No lymphadenopathy of posterior or anterior cervical chain or axillae bilaterally.  Gait normal with good balance and coordination.  MSK:  Non tender with full range of motion and good stability and symmetric strength and tone of shoulders, elbows, wrist, hip, knee and ankles bilaterally.   Neck shows that patient does have fullness noted of the right trapezius.  No masses are appreciated.  Patient does have full range of motion.  Patient is tender to palpation diffusely. Back Exam:  Inspection: Mild loss of lordosis Motion: Flexion 45 deg, Extension 45 deg, Side Bending to 45 deg bilaterally,  Rotation to 45 deg bilaterally  SLR laying: Negative  XSLR laying: Negative  Palpable tenderness: Diffusely tender even to light palpation. FABER: negative. Sensory change: Gross sensation intact to  all lumbar and sacral dermatomes.  Reflexes: 2+ at both patellar tendons, 2+ at achilles tendons, Babinski's downgoing.  Strength at foot  Plantar-flexion: 5/5 Dorsi-flexion: 5/5 Eversion: 5/5 Inversion: 5/5  Leg strength  Quad: 5/5 Hamstring: 5/5 Hip flexor: 5/5 Hip abductors: 5/5   After verbal consent patient was prepped with alcohol swabs and with a 25-gauge half inch needle injected with 0.5 cc of 0.5% Marcaine and 0.5 cc of Kenalog 40 mg/mL into the right shoulder region.  Total of 4 trigger points in 4 different muscles including the trapezius, scalene, rhomboid and latissimus dorsi no blood loss.  Postinjection instructions given   Impression and Recommendations:     This case required medical decision making of moderate complexity. The above documentation has been reviewed and is accurate and complete , DO       Note: This dictation was prepared with Dragon dictation along  with smaller phrase technology. Any transcriptional errors that result from this process are unintentional.

## 2019-09-21 ENCOUNTER — Other Ambulatory Visit: Payer: Self-pay

## 2019-09-21 ENCOUNTER — Encounter: Payer: Self-pay | Admitting: Family Medicine

## 2019-09-21 ENCOUNTER — Ambulatory Visit: Payer: No Typology Code available for payment source | Admitting: Family Medicine

## 2019-09-21 VITALS — BP 130/80 | HR 92 | Ht 66.0 in | Wt 205.0 lb

## 2019-09-21 DIAGNOSIS — G43809 Other migraine, not intractable, without status migrainosus: Secondary | ICD-10-CM

## 2019-09-21 DIAGNOSIS — R519 Headache, unspecified: Secondary | ICD-10-CM

## 2019-09-21 DIAGNOSIS — D509 Iron deficiency anemia, unspecified: Secondary | ICD-10-CM | POA: Insufficient documentation

## 2019-09-21 DIAGNOSIS — M25511 Pain in right shoulder: Secondary | ICD-10-CM | POA: Insufficient documentation

## 2019-09-21 DIAGNOSIS — G8929 Other chronic pain: Secondary | ICD-10-CM

## 2019-09-21 DIAGNOSIS — D508 Other iron deficiency anemias: Secondary | ICD-10-CM | POA: Diagnosis not present

## 2019-09-21 MED ORDER — MELOXICAM 7.5 MG PO TABS: 7.5000 mg | ORAL_TABLET | Freq: Every day | ORAL | 0 refills | Status: DC

## 2019-09-21 MED ORDER — CYCLOBENZAPRINE HCL 10 MG PO TABS: 10.0000 mg | ORAL_TABLET | Freq: Three times a day (TID) | ORAL | 0 refills | Status: DC | PRN

## 2019-09-21 NOTE — Patient Instructions (Addendum)
Exercise 3 times a week Iron 65 mg twice a day with 500 mg vitamin C Eat meals with vitamin C Once you have PCP tell us who so we can order labs  Continue vitamin D We can do virtual in 2 months

## 2019-09-21 NOTE — Assessment & Plan Note (Signed)
Trigger point given today.  Patient has fullness of the trapezius noted.  We did discuss advanced imaging which patient declined at the moment.  Patient is living in [place] and is looking at a another provider in that area.  I am hoping that this does help and patient did have some benefit from the injections previously.

## 2019-09-21 NOTE — Assessment & Plan Note (Signed)
Patient patient does have iron deficiency anemia that I think is contributing to a lot of patient's aches and pains.  Ferritin of 7.6.  Elevation of sedimentation rate 85.  Low vitamin D could also be contributing to some.  Encourage her to continue to once weekly vitamin D, prednisone, discussed home exercises attempted trigger points into the right trapezius.  We discussed otherwise would be talking about advanced imaging which patient declined today.  Patient did talk about pain medications which we once again told her we would not do.  Patient did want a referral though to neurology to discuss the chronic headaches.

## 2019-10-06 ENCOUNTER — Ambulatory Visit: Payer: No Typology Code available for payment source | Admitting: Family Medicine

## 2019-11-23 ENCOUNTER — Ambulatory Visit: Payer: No Typology Code available for payment source | Admitting: Family Medicine

## 2020-08-07 ENCOUNTER — Ambulatory Visit: Payer: No Typology Code available for payment source | Admitting: Family Medicine

## 2020-10-16 ENCOUNTER — Ambulatory Visit: Payer: No Typology Code available for payment source | Admitting: Family Medicine

## 2021-01-10 NOTE — Progress Notes (Signed)
[facility] [address] [place] [address] Phone: [phone] Subjective:   [name], am serving as a scribe for Dr. Antoine Primas. This visit occurred during the SARS-CoV-2 public health emergency.  Safety protocols were in place, including screening questions prior to the visit, additional usage of staff PPE, and extensive cleaning of exam room while observing appropriate contact time as indicated for disinfecting solutions.   I'm seeing this patient by the request  of:  Autry-Lott, Simone, DO  CC: Neck pain follow-up  [id number]   09/21/2019 Trigger point given today.  Patient has fullness of the trapezius noted.  We did discuss advanced imaging which patient declined at the moment.  Patient is living in [place] and is looking at a another provider in that area.  I am hoping that this does help and patient did have some benefit from the injections previously.  Patient patient does have iron deficiency anemia that I think is contributing to a lot of patient's aches and pains.  Ferritin of 7.6.  Elevation of sedimentation rate 85.  Low vitamin D could also be contributing to some.  Encourage her to continue to once weekly vitamin D, prednisone, discussed home exercises attempted trigger points into the right trapezius.  We discussed otherwise would be talking about advanced imaging which patient declined today.  Patient did talk about pain medications which we once again told her we would not do.  Patient did want a referral though to neurology to discuss the chronic headaches.  Do not have notes of previous surgery.  Patient is unable to tell me exactly what it was from.  Forearm incision is approximately 4 cm proximal to the wrist itself.  Scar tissue formation noted and we discussed massage as well as topical anti-inflammatories to see if this will help.  Otherwise nothing found significantly on exam.  Update 01/11/2021 Tara Wheeler is a  35 y.o. female coming in with complaint of shoulder trigger point. Patient states that she has been using TENS, massage, acupuncture. Continues to get migraines and the weather seems to increase pain in traps as does stress.  Patient states that over the years some mild increase in stress recently.  Patient is taking care of her son who does have autism.  Patient has not been taking care of herself regularly.  Right wrist pain continues as well. Changes noted with barometric pressure fluctuations.  Patient would like to know what else she can do for the wrist.       Past Medical History:  Diagnosis Date  . Asthma   . Miscarriage   . Pregnancy   . Pregnancy induced hypertension   . Scoliosis    Past Surgical History:  Procedure Laterality Date  . NO PAST SURGERIES     Social History   Socioeconomic History  . Marital status: Single    Spouse name: Not on file  . Number of children: Not on file  . Years of education: Not on file  . Highest education level: Not on file  Occupational History  . Not on file  Tobacco Use  . Smoking status: Never Smoker  . Smokeless tobacco: Never Used  Substance and Sexual Activity  . Alcohol use: No  . Drug use: No  . Sexual activity: Yes  Other Topics Concern  . Not on file  Social History Narrative  . Not on file   Social Determinants of Health   Financial Resource Strain: Not on file  Food Insecurity: Not on file  Transportation Needs: Not on file  Physical Activity: Not on file  Stress: Not on file  Social Connections: Not on file   No Known Allergies Family History  Problem Relation Age of Onset  . Hypertension Mother   . Hypertension Father   . Diabetes Maternal Aunt     Current Outpatient Medications (Endocrine & Metabolic):  .  norgestimate-ethinyl estradiol (SPRINTEC 28) 0.25-35 MG-MCG tablet, Take 1 tablet by mouth daily.   Current Outpatient Medications (Respiratory):  .  albuterol (PROVENTIL HFA;VENTOLIN HFA) 108  (90 Base) MCG/ACT inhaler, Inhale 2 puffs into the lungs every 6 (six) hours as needed for wheezing or shortness of breath. .  fluticasone (FLOVENT HFA) 110 MCG/ACT inhaler, Inhale 2 puffs into the lungs 2 (two) times daily.  Current Outpatient Medications (Analgesics):  .  ibuprofen (ADVIL,MOTRIN) 600 MG tablet, Take 1 tablet (600 mg total) by mouth every 8 (eight) hours as needed. (Patient taking differently: Take 600 mg by mouth every 8 (eight) hours as needed for mild pain.) .  meloxicam (MOBIC) 7.5 MG tablet, Take 1 tablet (7.5 mg total) by mouth daily.   Current Outpatient Medications (Other):  .  cyclobenzaprine (FLEXERIL) 10 MG tablet, Take 1 tablet (10 mg total) by mouth 3 (three) times daily as needed for muscle spasms. .  Vitamin D, Ergocalciferol, (DRISDOL) 1.25 MG (50000 UT) CAPS capsule, Take 1 capsule (50,000 Units total) by mouth every 7 (seven) days.   Reviewed prior external information including notes and imaging from  primary care provider As well as notes that were available from care everywhere and other healthcare systems.  Past medical history, social, surgical and family history all reviewed in electronic medical record.  No pertanent information unless stated regarding to the chief complaint.   Review of Systems:  No  visual changes, nausea, vomiting, diarrhea, constipation, dizziness, abdominal pain, skin rash, fevers, chills, night sweats, weight loss, swollen lymph nodes,, joint swelling, chest pain, shortness of breath, mood changes. POSITIVE muscle aches, body aches, headaches increasing fatigue  Objective  Blood pressure (!) 132/96, pulse (!) 106, height 5\' 6"  (1.676 m), weight 220 lb (99.8 kg), last menstrual period 12/27/2020, SpO2 98 %.   General: No apparent distress alert and oriented x3 mood and affect normal, dressed appropriately.  HEENT: Pupils equal, extraocular movements intact  Respiratory: Patient's speak in full sentences and does not appear  short of breath  Cardiovascular: No lower extremity edema, non tender, no erythema  Gait normal with good balance and coordination.  MSK: Patient's neck exam is still shows that there is a fullness and hardness noted of the right trapezius compared to the contralateral side.  No true masses appreciated though. Patient has 5 out of 5 strength of the extremity.  Patient though is sore in multiple different soft tissue areas and on multiple joints even to light palpation.  Seems to be somewhat out of proportion to the amount of palpation.  Neck exam mild tightness with right-sided sidebending and left-sided rotation.  Negative Spurling's but patient is incredibly tender with the exam.  Right wrist exam shows the patient does have a previous surgery over the radius approximately 3 cm proximal to the wrist crease.  Does have keloid formation noted there.  Mild feeling of fullness but no masses appreciated.  Patient has relatively good grip strength and near symmetric to the contralateral side.   Impression and Recommendations:     The above documentation has been reviewed and is  accurate and complete Tara Pulley, DO

## 2021-01-11 ENCOUNTER — Ambulatory Visit (INDEPENDENT_AMBULATORY_CARE_PROVIDER_SITE_OTHER): Payer: Commercial Managed Care - PPO | Admitting: Family Medicine

## 2021-01-11 ENCOUNTER — Encounter: Payer: Self-pay | Admitting: Family Medicine

## 2021-01-11 ENCOUNTER — Ambulatory Visit (INDEPENDENT_AMBULATORY_CARE_PROVIDER_SITE_OTHER): Payer: Commercial Managed Care - PPO

## 2021-01-11 ENCOUNTER — Other Ambulatory Visit: Payer: Self-pay

## 2021-01-11 VITALS — BP 132/96 | HR 106 | Ht 66.0 in | Wt 220.0 lb

## 2021-01-11 DIAGNOSIS — M2550 Pain in unspecified joint: Secondary | ICD-10-CM

## 2021-01-11 DIAGNOSIS — M25531 Pain in right wrist: Secondary | ICD-10-CM | POA: Diagnosis not present

## 2021-01-11 DIAGNOSIS — R519 Headache, unspecified: Secondary | ICD-10-CM

## 2021-01-11 DIAGNOSIS — M25511 Pain in right shoulder: Secondary | ICD-10-CM

## 2021-01-11 DIAGNOSIS — G8929 Other chronic pain: Secondary | ICD-10-CM

## 2021-01-11 DIAGNOSIS — M542 Cervicalgia: Secondary | ICD-10-CM

## 2021-01-11 DIAGNOSIS — D508 Other iron deficiency anemias: Secondary | ICD-10-CM

## 2021-01-11 LAB — CBC WITH DIFFERENTIAL/PLATELET
Basophils Absolute: 0.1 10*3/uL (ref 0.0–0.1)
Basophils Relative: 0.9 % (ref 0.0–3.0)
Eosinophils Absolute: 0.1 10*3/uL (ref 0.0–0.7)
Eosinophils Relative: 1.2 % (ref 0.0–5.0)
HCT: 32.6 % — ABNORMAL LOW (ref 36.0–46.0)
Hemoglobin: 10.5 g/dL — ABNORMAL LOW (ref 12.0–15.0)
Lymphocytes Relative: 26.1 % (ref 12.0–46.0)
Lymphs Abs: 2.4 10*3/uL (ref 0.7–4.0)
MCHC: 32.2 g/dL (ref 30.0–36.0)
MCV: 84 fl (ref 78.0–100.0)
Monocytes Absolute: 0.7 10*3/uL (ref 0.1–1.0)
Monocytes Relative: 7.3 % (ref 3.0–12.0)
Neutro Abs: 6 10*3/uL (ref 1.4–7.7)
Neutrophils Relative %: 64.5 % (ref 43.0–77.0)
Platelets: 440 10*3/uL — ABNORMAL HIGH (ref 150.0–400.0)
RBC: 3.88 Mil/uL (ref 3.87–5.11)
RDW: 18.2 % — ABNORMAL HIGH (ref 11.5–15.5)
WBC: 9.3 10*3/uL (ref 4.0–10.5)

## 2021-01-11 LAB — COMPREHENSIVE METABOLIC PANEL
ALT: 18 U/L (ref 0–35)
AST: 13 U/L (ref 0–37)
Albumin: 3.8 g/dL (ref 3.5–5.2)
Alkaline Phosphatase: 90 U/L (ref 39–117)
BUN: 10 mg/dL (ref 6–23)
CO2: 30 mEq/L (ref 19–32)
Calcium: 9 mg/dL (ref 8.4–10.5)
Chloride: 103 mEq/L (ref 96–112)
Creatinine, Ser: 0.92 mg/dL (ref 0.40–1.20)
GFR: 82.26 mL/min (ref >60.00)
Glucose, Bld: 80 mg/dL (ref 70–99)
Potassium: 3.6 mEq/L (ref 3.5–5.1)
Sodium: 138 mEq/L (ref 135–145)
Total Bilirubin: 0.2 mg/dL (ref 0.2–1.2)
Total Protein: 7.2 g/dL (ref 6.0–8.3)

## 2021-01-11 LAB — SEDIMENTATION RATE: Sed Rate: 31 mm/hr — ABNORMAL HIGH (ref 0–20)

## 2021-01-11 LAB — IBC PANEL
Iron: 40 ug/dL — ABNORMAL LOW (ref 42–145)
Saturation Ratios: 9.4 % — ABNORMAL LOW (ref 20.0–50.0)
Transferrin: 303 mg/dL (ref 212.0–360.0)

## 2021-01-11 LAB — VITAMIN D 25 HYDROXY (VIT D DEFICIENCY, FRACTURES): VITD: 18.46 ng/mL — ABNORMAL LOW (ref 30.00–100.00)

## 2021-01-11 LAB — FERRITIN: Ferritin: 13.7 ng/mL (ref 10.0–291.0)

## 2021-01-11 NOTE — Assessment & Plan Note (Signed)
Even though patient has not been seen for over a year patient states that the injections did not seem to be beneficial.  At this point I will get x-rays to further evaluate the neck and the shoulder.  Patient has had difficulty with polyarthralgia and had found to have low iron.  I do think that that is contributing to some of the aches and pains as well and will repeat labs.  Patient had difficulty previously with a ferritin of 7.6 and sedimentation rate of 85.  Depending on findings we did discuss that she should follow-up with her primary care provider.  We will actually get patient into formal physical therapy in [place] where she is living.  Patient has muscle relaxers that have been prescribed to her previously as well as we discussed the meloxicam again.  Patient will follow up with me otherwise in 2 to 3 months

## 2021-01-11 NOTE — Assessment & Plan Note (Signed)
Patient did have surgical intervention previously.  Seems to be likely more of a removal of a potential cyst.  Does have a keloid in the area.  Would like her to start with some range of motion exercises.  X-rays pending.  We will start with formal physical therapy.  Follow-up again in 2 months

## 2021-01-11 NOTE — Assessment & Plan Note (Signed)
Patient has been seen by [facility] neurology but encouraged her to continue the vitamin supplementations.  May need further work-up as well.

## 2021-01-11 NOTE — Assessment & Plan Note (Signed)
Rechecking iron levels today.  Likely contributing to some of the chronic headaches and migraines.

## 2021-01-11 NOTE — Patient Instructions (Addendum)
Cervical and R shoulder xray, R wrist Labs today PT [facility]-neck and wrist pain See me again in 2-3 months

## 2021-01-16 ENCOUNTER — Other Ambulatory Visit: Payer: Self-pay

## 2021-01-16 DIAGNOSIS — E559 Vitamin D deficiency, unspecified: Secondary | ICD-10-CM

## 2021-01-16 DIAGNOSIS — E611 Iron deficiency: Secondary | ICD-10-CM

## 2021-03-09 ENCOUNTER — Ambulatory Visit: Payer: Commercial Managed Care - PPO | Admitting: Family Medicine

## 2022-04-09 ENCOUNTER — Encounter: Payer: Self-pay | Admitting: *Deleted

## 2022-07-24 ENCOUNTER — Encounter: Payer: Self-pay | Admitting: Family Medicine

## 2022-07-24 ENCOUNTER — Ambulatory Visit (INDEPENDENT_AMBULATORY_CARE_PROVIDER_SITE_OTHER): Payer: Self-pay | Admitting: Family Medicine

## 2022-07-24 DIAGNOSIS — M25511 Pain in right shoulder: Secondary | ICD-10-CM

## 2022-07-24 NOTE — Progress Notes (Signed)
[name] [address] [place] [address] Phone: [phone] Subjective:    I'm seeing this patient by the request  of:  Autry-Lott, Simone, DO  CC: Right shoulder follow-up  [id number]  01/11/2021 Patient has been seen by [facility] neurology but encouraged her to continue the vitamin supplementations.  May need further work-up as well.  Rechecking iron levels today.  Likely contributing to some of the chronic headaches and migraines.  Patient did have surgical intervention previously.  Seems to be likely more of a removal of a potential cyst.  Does have a keloid in the area.  Would like her to start with some range of motion exercises.  X-rays pending.  We will start with formal physical therapy.  Follow-up again in 2 months  Even though patient has not been seen for over a year patient states that the injections did not seem to be beneficial.  At this point I will get x-rays to further evaluate the neck and the shoulder.  Patient has had difficulty with polyarthralgia and had found to have low iron.  I do think that that is contributing to some of the aches and pains as well and will repeat labs.  Patient had difficulty previously with a ferritin of 7.6 and sedimentation rate of 85.  Depending on findings we did discuss that she should follow-up with her primary care provider.  We will actually get patient into formal physical therapy in [place] where she is living.  Patient has muscle relaxers that have been prescribed to her previously as well as we discussed the meloxicam again.  Patient will follow up with me otherwise in 2 to 3 months  Updated 07/24/2022 Tara Wheeler is a 36 y.o. female coming in with complaint of shoulder pain. Having the same tightness and knots in both traps. Would like trigger point injections. Getting to the point where its causing headaches. Was told by a sports med doc in [place] that the swelling in her right hand is  because she has blood flow issues that is caused by the knots in her shoulder. No other issues.       Past Medical History:  Diagnosis Date   Asthma    Miscarriage    Pregnancy    Pregnancy induced hypertension    Scoliosis    Past Surgical History:  Procedure Laterality Date   NO PAST SURGERIES     Social History   Socioeconomic History   Marital status: Single    Spouse name: Not on file   Number of children: Not on file   Years of education: Not on file   Highest education level: Not on file  Occupational History   Not on file  Tobacco Use   Smoking status: Never   Smokeless tobacco: Never  Substance and Sexual Activity   Alcohol use: No   Drug use: No   Sexual activity: Yes  Other Topics Concern   Not on file  Social History Narrative   Not on file   Social Determinants of Health   Financial Resource Strain: Not on file  Food Insecurity: Not on file  Transportation Needs: Not on file  Physical Activity: Not on file  Stress: Not on file  Social Connections: Not on file   No Known Allergies Family History  Problem Relation Age of Onset   Hypertension Mother    Hypertension Father    Diabetes Maternal Aunt     Current Outpatient Medications (Endocrine & Metabolic):  norgestimate-ethinyl estradiol (SPRINTEC 28) 0.25-35 MG-MCG tablet, Take 1 tablet by mouth daily.   Current Outpatient Medications (Respiratory):    albuterol (PROVENTIL HFA;VENTOLIN HFA) 108 (90 Base) MCG/ACT inhaler, Inhale 2 puffs into the lungs every 6 (six) hours as needed for wheezing or shortness of breath.   fluticasone (FLOVENT HFA) 110 MCG/ACT inhaler, Inhale 2 puffs into the lungs 2 (two) times daily.  Current Outpatient Medications (Analgesics):    ibuprofen (ADVIL,MOTRIN) 600 MG tablet, Take 1 tablet (600 mg total) by mouth every 8 (eight) hours as needed. (Patient taking differently: Take 600 mg by mouth every 8 (eight) hours as needed for mild pain.)   meloxicam (MOBIC)  7.5 MG tablet, Take 1 tablet (7.5 mg total) by mouth daily.   Current Outpatient Medications (Other):    cyclobenzaprine (FLEXERIL) 10 MG tablet, Take 1 tablet (10 mg total) by mouth 3 (three) times daily as needed for muscle spasms.   Vitamin D, Ergocalciferol, (DRISDOL) 1.25 MG (50000 UT) CAPS capsule, Take 1 capsule (50,000 Units total) by mouth every 7 (seven) days.   Reviewed prior external information including notes and imaging from  primary care provider As well as notes that were available from care everywhere and other healthcare systems.  Past medical history, social, surgical and family history all reviewed in electronic medical record.  No pertanent information unless stated regarding to the chief complaint.   Review of Systems:  No headache, visual changes, nausea, vomiting, diarrhea, constipation, dizziness, abdominal pain, skin rash, fevers, chills, night sweats, weight loss, swollen lymph nodes, body aches, joint swelling, chest pain, shortness of breath, mood changes. POSITIVE muscle aches  Objective  Blood pressure 114/76, height 5\' 6"  (1.676 m), weight 200 lb (90.7 kg).   General: No apparent distress alert and oriented x3 mood and affect normal, dressed appropriately.  HEENT: Pupils equal, extraocular movements intact  Respiratory: Patient's speak in full sentences and does not appear short of breath  Cardiovascular: No lower extremity edema, non tender, no erythema neck exam does have full range of motion.  Negative Spurling's.  5 out of 5 strength of the hands. Right shoulder has a significant tightness again in the trapezius.  We have seen him tighter previously.  No true masses though appreciated.  Significantly harder than what would be anticipated though in this area.  Starting to have multiple trigger points on the right side in the trapezius, rhomboid and levator scapula.  Patient has 2 similar ones on the contralateral side.   After verbal consent patient  patient was prepped with alcohol swab and with a 21-gauge 1 inch needle patient was injected in 3 distinct trigger points in the right shoulder and 2 distinct trigger points in the left shoulder with a total of 4 cc of 0.5 %/mL of Marcaine and 2 cc of 40 mg/mL of Kenalog.  Mild blood loss.  Postinjection instructions given.    Impression and Recommendations:    The above documentation has been reviewed and is accurate and complete Lyndal Pulley, DO

## 2022-07-24 NOTE — Patient Instructions (Addendum)
Good to see you!  Trigger point injections in both traps

## 2022-07-24 NOTE — Assessment & Plan Note (Signed)
Repeat injection given again today.  Patient continues to have tightness of the trapezius.  We have discussed again about the possibility of advanced imaging.  Patient declined it again.  It has been nearly 2 years since we have done the injections that did seem to improve.  Previous x-rays were unremarkable.  Has had difficulty with iron supplementation but does have low iron and ferritin previously.  Discussed with patient again that I would recommend if this comes back sooner than 6 months at least I would highly recommend MRI or CT of the soft tissues of the neck and the upper thoracic area.

## 2022-08-30 ENCOUNTER — Telehealth: Payer: Self-pay | Admitting: Family Medicine

## 2022-08-30 NOTE — Telephone Encounter (Signed)
Patient called stating that she is having a lot of shoulder pain. She didn't know if it was too soon for another injection or if there are any other options?  She went ahead and scheduled with Dr Tamala Julian on Tuesday.   Please advise.

## 2022-09-02 NOTE — Progress Notes (Deleted)
[facility] [place] Phone: [phone] Subjective:    I'm seeing this patient by the request  of:  Autry-Lott, Simone, DO  CC:   [id number]  07/24/2022 Repeat injection given again today.  Patient continues to have tightness of the trapezius.  We have discussed again about the possibility of advanced imaging.  Patient declined it again.  It has been nearly 2 years since we have done the injections that did seem to improve.  Previous x-rays were unremarkable.  Has had difficulty with iron supplementation but does have low iron and ferritin previously.  Discussed with patient again that I would recommend if this comes back sooner than 6 months at least I would highly recommend MRI or CT of the soft tissues of the neck and the upper thoracic area.  Update 09/03/2022 Tara Wheeler is a 36 y.o. female coming in with complaint of R shoulder pain. Patient states        Past Medical History:  Diagnosis Date   Asthma    Miscarriage    Pregnancy    Pregnancy induced hypertension    Scoliosis    Past Surgical History:  Procedure Laterality Date   NO PAST SURGERIES     Social History   Socioeconomic History   Marital status: Single    Spouse name: Not on file   Number of children: Not on file   Years of education: Not on file   Highest education level: Not on file  Occupational History   Not on file  Tobacco Use   Smoking status: Never   Smokeless tobacco: Never  Substance and Sexual Activity   Alcohol use: No   Drug use: No   Sexual activity: Yes  Other Topics Concern   Not on file  Social History Narrative   Not on file   Social Determinants of Health   Financial Resource Strain: Not on file  Food Insecurity: Not on file  Transportation Needs: Not on file  Physical Activity: Not on file  Stress: Not on file  Social Connections: Not on file   No Known Allergies Family History  Problem Relation  Age of Onset   Hypertension Mother    Hypertension Father    Diabetes Maternal Aunt     Current Outpatient Medications (Endocrine & Metabolic):    norgestimate-ethinyl estradiol (SPRINTEC 28) 0.25-35 MG-MCG tablet, Take 1 tablet by mouth daily.   Current Outpatient Medications (Respiratory):    albuterol (PROVENTIL HFA;VENTOLIN HFA) 108 (90 Base) MCG/ACT inhaler, Inhale 2 puffs into the lungs every 6 (six) hours as needed for wheezing or shortness of breath.   fluticasone (FLOVENT HFA) 110 MCG/ACT inhaler, Inhale 2 puffs into the lungs 2 (two) times daily.  Current Outpatient Medications (Analgesics):    ibuprofen (ADVIL,MOTRIN) 600 MG tablet, Take 1 tablet (600 mg total) by mouth every 8 (eight) hours as needed. (Patient taking differently: Take 600 mg by mouth every 8 (eight) hours as needed for mild pain.)   meloxicam (MOBIC) 7.5 MG tablet, Take 1 tablet (7.5 mg total) by mouth daily.   Current Outpatient Medications (Other):    cyclobenzaprine (FLEXERIL) 10 MG tablet, Take 1 tablet (10 mg total) by mouth 3 (three) times daily as needed for muscle spasms.   Vitamin D, Ergocalciferol, (DRISDOL) 1.25 MG (50000 UT) CAPS capsule, Take 1 capsule (50,000 Units total) by mouth every 7 (seven) days.   Reviewed prior external information including notes and imaging from  primary  care provider As well as notes that were available from care everywhere and other healthcare systems.  Past medical history, social, surgical and family history all reviewed in electronic medical record.  No pertanent information unless stated regarding to the chief complaint.   Review of Systems:  No headache, visual changes, nausea, vomiting, diarrhea, constipation, dizziness, abdominal pain, skin rash, fevers, chills, night sweats, weight loss, swollen lymph nodes, body aches, joint swelling, chest pain, shortness of breath, mood changes. POSITIVE muscle aches  Objective  There were no vitals taken for this  visit.   General: No apparent distress alert and oriented x3 mood and affect normal, dressed appropriately.  HEENT: Pupils equal, extraocular movements intact  Respiratory: Patient's speak in full sentences and does not appear short of breath  Cardiovascular: No lower extremity edema, non tender, no erythema      Impression and Recommendations:

## 2022-09-03 ENCOUNTER — Ambulatory Visit: Payer: Medicaid Other | Admitting: Family Medicine

## 2022-09-04 NOTE — Telephone Encounter (Signed)
Pt canceled appointment stating she was feeling better.

## 2022-10-21 NOTE — Progress Notes (Unsigned)
[name] [address] [place] [address] Phone: [phone] Subjective:   [name], am serving as a scribe for Dr. Antoine Primas.  I'm seeing this patient by the request  of:  Autry-Lott, Simone, DO  CC: Shoulder pain and polyarthralgia follow-up  [id number]  07/24/2022 Repeat injection given again today. Patient continues to have tightness of the trapezius. We have discussed again about the possibility of advanced imaging. Patient declined it again. It has been nearly 2 years since we have done the injections that did seem to improve. Previous x-rays were unremarkable. Has had difficulty with iron supplementation but does have low iron and ferritin previously. Discussed with patient again that I would recommend if this comes back sooner than 6 months at least I would highly recommend MRI or CT of the soft tissues of the neck and the upper thoracic area.   Update 10/23/2022 Marveline Mccay is a 36 y.o. female coming in with complaint of R shoulder pain.  Patient was given trigger point injections 3 months ago.  Patient states that she has not been able to start PT. Had deep tissue massage which was helpful.    Patient expresses having a depressed mood following the loss of her father and recent suicide of her brother.       Past Medical History:  Diagnosis Date   Asthma    Miscarriage    Pregnancy    Pregnancy induced hypertension    Scoliosis    Past Surgical History:  Procedure Laterality Date   NO PAST SURGERIES     Social History   Socioeconomic History   Marital status: Single    Spouse name: Not on file   Number of children: Not on file   Years of education: Not on file   Highest education level: Not on file  Occupational History   Not on file  Tobacco Use   Smoking status: Never   Smokeless tobacco: Never  Substance and Sexual Activity   Alcohol use: No   Drug use: No   Sexual activity: Yes  Other Topics  Concern   Not on file  Social History Narrative   Not on file   Social Determinants of Health   Financial Resource Strain: Not on file  Food Insecurity: Not on file  Transportation Needs: Not on file  Physical Activity: Not on file  Stress: Not on file  Social Connections: Not on file   No Known Allergies Family History  Problem Relation Age of Onset   Hypertension Mother    Hypertension Father    Diabetes Maternal Aunt     Current Outpatient Medications (Endocrine & Metabolic):    norgestimate-ethinyl estradiol (SPRINTEC 28) 0.25-35 MG-MCG tablet, Take 1 tablet by mouth daily.   Current Outpatient Medications (Respiratory):    albuterol (PROVENTIL HFA;VENTOLIN HFA) 108 (90 Base) MCG/ACT inhaler, Inhale 2 puffs into the lungs every 6 (six) hours as needed for wheezing or shortness of breath.   fluticasone (FLOVENT HFA) 110 MCG/ACT inhaler, Inhale 2 puffs into the lungs 2 (two) times daily.  Current Outpatient Medications (Analgesics):    ibuprofen (ADVIL,MOTRIN) 600 MG tablet, Take 1 tablet (600 mg total) by mouth every 8 (eight) hours as needed. (Patient taking differently: Take 600 mg by mouth every 8 (eight) hours as needed for mild pain.)   meloxicam (MOBIC) 7.5 MG tablet, Take 1 tablet (7.5 mg total) by mouth daily.   Current Outpatient Medications (Other):    cyclobenzaprine (FLEXERIL)  10 MG tablet, Take 1 tablet (10 mg total) by mouth 3 (three) times daily as needed for muscle spasms.   Vitamin D, Ergocalciferol, (DRISDOL) 1.25 MG (50000 UT) CAPS capsule, Take 1 capsule (50,000 Units total) by mouth every 7 (seven) days.   Reviewed prior external information including notes and imaging from  primary care provider As well as notes that were available from care everywhere and other healthcare systems.  Past medical history, social, surgical and family history all reviewed in electronic medical record.  No pertanent information unless stated regarding to the chief  complaint.   Review of Systems:  No visual changes, nausea, vomiting, diarrhea, constipation, dizziness, abdominal pain, skin rash, fevers, chills, night sweats, weight loss, swollen lymph nodes,  joint swelling, chest pain, shortness of breath, mood changes. POSITIVE muscle aches, body aches, headache  Objective  Blood pressure 120/82, height 5\' 6"  (1.676 m), weight 217 lb (98.4 kg).   General: No apparent distress alert and oriented x3 mood and affect normal, dressed appropriately.  HEENT: Pupils equal, extraocular movements intact  Respiratory: Patient's speak in full sentences and does not appear short of breath  Cardiovascular: No lower extremity edema, non tender, no erythema  Right shoulder does have significant tightness noted.  Multiple trigger points in the right and left shoulder region noted.  Patient does have some limited sidebending bilaterally.  After verbal consent patient was prepped with alcohol swabs and with a 25-gauge half inch needle injected into 5 distinct trigger points including the right and left trapezius, the right and left rhomboid and the left latissimus dorsi.  Total of 2 cc of 0.5% Marcaine and 1 cc of Kenalog 40 mg/mL used minimal blood loss.  Band-Aid placed.  Postinjection instructions given    Impression and Recommendations:     The above documentation has been reviewed and is accurate and complete , DO

## 2022-10-23 ENCOUNTER — Ambulatory Visit: Payer: Medicaid Other | Admitting: Family Medicine

## 2022-10-23 ENCOUNTER — Ambulatory Visit: Payer: Self-pay

## 2022-10-23 VITALS — BP 120/82 | Ht 66.0 in | Wt 217.0 lb

## 2022-10-23 DIAGNOSIS — M25511 Pain in right shoulder: Secondary | ICD-10-CM

## 2022-10-23 DIAGNOSIS — M25512 Pain in left shoulder: Secondary | ICD-10-CM

## 2022-10-23 DIAGNOSIS — M542 Cervicalgia: Secondary | ICD-10-CM

## 2022-10-23 DIAGNOSIS — D508 Other iron deficiency anemias: Secondary | ICD-10-CM

## 2022-10-23 DIAGNOSIS — F4321 Adjustment disorder with depressed mood: Secondary | ICD-10-CM | POA: Diagnosis not present

## 2022-10-23 DIAGNOSIS — F3289 Other specified depressive episodes: Secondary | ICD-10-CM | POA: Diagnosis not present

## 2022-10-23 NOTE — Assessment & Plan Note (Signed)
Patient has lost 2 family members in the last 3 months.  Have patient is highly anxious and is having difficulty.  In the process of potentially moving as well.  Patient was referred to behavioral health to help.  Action plan discussed.

## 2022-10-23 NOTE — Assessment & Plan Note (Signed)
The patient had seen primary care recently and will need to consider the possibility of laboratory workup again.

## 2022-10-23 NOTE — Patient Instructions (Addendum)
Trigger point injections PT will call you- [facility]: [address], [place], [place] [address] [facility] will call you 1000mg  of Vit C Consider a B complex See me again 6 weeks

## 2022-10-23 NOTE — Assessment & Plan Note (Signed)
Repeat trigger point injections given again today.  Tolerated the procedure well.  Patient has had difficulty though with iron deficiency and has had elevated sedimentation rate previously.  Patient was going through a lot at this moment and did not want to get labs but I do think that it might be necessary again if patient continues to have difficulty.  Follow-up with me again in 6 to 12 weeks

## 2022-11-11 ENCOUNTER — Telehealth: Payer: Self-pay | Admitting: Family Medicine

## 2022-11-11 NOTE — Telephone Encounter (Signed)
We referred pt to [facility], but they do not participate with her insurance.  Please resend referral to: [place] PHONE [phone] FAX [phone]

## 2022-11-12 NOTE — Telephone Encounter (Signed)
Referral changed and faxed. 

## 2022-11-16 ENCOUNTER — Encounter: Payer: Self-pay | Admitting: Family Medicine

## 2022-11-18 NOTE — Telephone Encounter (Signed)
Reordered referral.

## 2022-11-18 NOTE — Addendum Note (Signed)
Addended by: Carmie Kanner on: 11/18/2022 08:27 AM   Modules accepted: Orders

## 2022-11-18 NOTE — Telephone Encounter (Signed)
[facility] called stating that the referral needs to be sent as Pain Management instead of Physical Therapy and asked to include back pain if possible.

## 2022-11-19 NOTE — Addendum Note (Signed)
Addended by: Carmie Kanner on: 11/19/2022 07:23 AM   Modules accepted: Orders

## 2022-12-03 ENCOUNTER — Ambulatory Visit (HOSPITAL_COMMUNITY): Payer: Self-pay | Admitting: Psychiatry

## 2022-12-03 NOTE — Progress Notes (Unsigned)
[place] [facility] Phone: [phone] Subjective:   Tara Wheeler, am serving as a scribe for Dr. Hulan Saas. I'm seeing this patient by the request  of:  Wheeler primary care provider on file.  CC: Right shoulder pain follow-up  [id number]  10/23/2022 Repeat trigger point injections given again today.  Tolerated the procedure well.  Patient has had difficulty though with iron deficiency and has had elevated sedimentation rate previously.  Patient was going through a lot at this moment and did not want to get labs but I do think that it might be necessary again if patient continues to have difficulty.  Follow-up with me again in 6 to 12 weeks    Update 12/04/2022 Tara Wheeler is a 37 y.o. female coming in with complaint of R shoulder pain. Patient sent to pain management/rehab for back pain at facility near home in [place]. Patient states that she saw her PCP who is setting her up with therapy and medication. Patient had xrays with a provider in [place] who noted a decrease in curvature in c spine and trigger points in her shoulders. Patient has been noticing some quivering her L trap. Injections last visit were very helpful.   Also getting set up with nutritionist prior to getting injections.        Past Medical History:  Diagnosis Date   Asthma    Miscarriage    Pregnancy    Pregnancy induced hypertension    Scoliosis    Past Surgical History:  Procedure Laterality Date   Wheeler PAST SURGERIES     Social History   Socioeconomic History   Marital status: Single    Spouse name: Not on file   Number of children: Not on file   Years of education: Not on file   Highest education level: Not on file  Occupational History   Not on file  Tobacco Use   Smoking status: Never   Smokeless tobacco: Never  Substance and Sexual Activity   Alcohol use: Wheeler   Drug use: Wheeler   Sexual activity: Yes  Other Topics  Concern   Not on file  Social History Narrative   Not on file   Social Determinants of Health   Financial Resource Strain: Not on file  Food Insecurity: Not on file  Transportation Needs: Not on file  Physical Activity: Not on file  Stress: Not on file  Social Connections: Not on file   Wheeler Known Allergies Family History  Problem Relation Age of Onset   Hypertension Mother    Hypertension Father    Diabetes Maternal Aunt       Current Outpatient Medications (Respiratory):    albuterol (PROVENTIL HFA;VENTOLIN HFA) 108 (90 Base) MCG/ACT inhaler, Inhale 2 puffs into the lungs every 6 (six) hours as needed for wheezing or shortness of breath.      Reviewed prior external information including notes and imaging from  primary care provider As well as notes that were available from care everywhere and other healthcare systems.  Past medical history, social, surgical and family history all reviewed in electronic medical record.  Wheeler pertanent information unless stated regarding to the chief complaint.   Review of Systems:  Wheeler headache, visual changes, nausea, vomiting, diarrhea, constipation, dizziness, abdominal pain, skin rash, fevers, chills, night sweats, weight loss, swollen lymph nodes, body aches, joint swelling, chest pain, shortness of breath, mood changes. POSITIVE muscle aches  Objective  Blood pressure 124/86,  pulse 95, height 5\' 6"  (1.676 m), weight 210 lb (95.3 kg), SpO2 98 %.   General: Wheeler apparent distress alert and oriented x3 mood and affect normal, dressed appropriately.  HEENT: Pupils equal, extraocular movements intact  Respiratory: Patient's speak in full sentences and does not appear short of breath  Cardiovascular: Wheeler lower extremity edema, non tender, Wheeler erythema  Left shoulder does have more trigger nodules noted today.  The patient does have tenderness to palpation in that area.  Right side significantly better after the injections.   After verbal  consent patient was prepped with alcohol swab and with a 25-gauge half inch needle injected into 3 distinct trigger points in the left interscapular, trapezius and rhomboid muscle.  A total of 3 cc of 0.5% Marcaine and 1 cc of Kenalog 40 mg/mL used.  Minimal blood loss.  Band-Aid placed.  Postinjection instructions given   Impression and Recommendations:     The above documentation has been reviewed and is accurate and complete Lyndal Pulley, DO

## 2022-12-04 ENCOUNTER — Ambulatory Visit: Payer: Medicaid Other | Admitting: Family Medicine

## 2022-12-04 VITALS — BP 124/86 | HR 95 | Ht 66.0 in | Wt 210.0 lb

## 2022-12-04 DIAGNOSIS — M25512 Pain in left shoulder: Secondary | ICD-10-CM | POA: Insufficient documentation

## 2022-12-04 NOTE — Assessment & Plan Note (Signed)
Patient given injections today and tolerated the procedure well, discussed icing regimen and home exercises, discussed which activities to do and which ones to avoid.  Patient is seeing other providers closer to her home in [place].  Do think that this will be significantly more beneficial.  Discussed icing regimen and home exercises otherwise.  Patient can follow-up with me again in 2 months but hopeful that patient will find care closer to her so this is not as much difficulty coming to see [place].

## 2022-12-04 NOTE — Patient Instructions (Addendum)
Trigger points today Keep doing exercises See me again in 2 months

## 2022-12-07 ENCOUNTER — Ambulatory Visit (HOSPITAL_COMMUNITY): Payer: Self-pay | Admitting: Psychiatry

## 2023-01-27 NOTE — Progress Notes (Deleted)
[facility] [address] [place] Phone: [phone] Subjective:    I'm seeing this patient by the request  of:  No primary care provider on file.  CC: shoulder pain   [id number]  12/04/2022 Patient given injections today and tolerated the procedure well, discussed icing regimen and home exercises, discussed which activities to do and which ones to avoid. Patient is seeing other providers closer to her home in [place]. Do think that this will be significantly more beneficial. Discussed icing regimen and home exercises otherwise. Patient can follow-up with me again in 2 months but hopeful that patient will find care closer to her so this is not as much difficulty coming to see [place].   Update 02/05/2023 Tara Wheeler is a 37 y.o. female coming in with complaint of thoracic spine pain. Patient states       Past Medical History:  Diagnosis Date   Asthma    Miscarriage    Pregnancy    Pregnancy induced hypertension    Scoliosis    Past Surgical History:  Procedure Laterality Date   NO PAST SURGERIES     Social History   Socioeconomic History   Marital status: Single    Spouse name: Not on file   Number of children: Not on file   Years of education: Not on file   Highest education level: Not on file  Occupational History   Not on file  Tobacco Use   Smoking status: Never   Smokeless tobacco: Never  Substance and Sexual Activity   Alcohol use: No   Drug use: No   Sexual activity: Yes  Other Topics Concern   Not on file  Social History Narrative   Not on file   Social Determinants of Health   Financial Resource Strain: Not on file  Food Insecurity: Not on file  Transportation Needs: Not on file  Physical Activity: Not on file  Stress: Not on file  Social Connections: Not on file   No Known Allergies Family History  Problem Relation Age of Onset   Hypertension Mother    Hypertension Father    Diabetes  Maternal Aunt       Current Outpatient Medications (Respiratory):    albuterol (PROVENTIL HFA;VENTOLIN HFA) 108 (90 Base) MCG/ACT inhaler, Inhale 2 puffs into the lungs every 6 (six) hours as needed for wheezing or shortness of breath.      Reviewed prior external information including notes and imaging from  primary care provider As well as notes that were available from care everywhere and other healthcare systems.  Past medical history, social, surgical and family history all reviewed in electronic medical record.  No pertanent information unless stated regarding to the chief complaint.   Review of Systems:  No headache, visual changes, nausea, vomiting, diarrhea, constipation, dizziness, abdominal pain, skin rash, fevers, chills, night sweats, weight loss, swollen lymph nodes, body aches, joint swelling, chest pain, shortness of breath, mood changes. POSITIVE muscle aches  Objective  There were no vitals taken for this visit.   General: No apparent distress alert and oriented x3 mood and affect normal, dressed appropriately.  HEENT: Pupils equal, extraocular movements intact  Respiratory: Patient's speak in full sentences and does not appear short of breath  Cardiovascular: No lower extremity edema, non tender, no erythema  Shoulder exam shows      Impression and Recommendations:    The above documentation has been reviewed and is accurate and complete Lyndal Pulley,  DO

## 2023-02-05 ENCOUNTER — Ambulatory Visit: Payer: Medicaid Other | Admitting: Family Medicine

## 2023-03-11 NOTE — Progress Notes (Unsigned)
[facility] [address] [place] [address] Phone: [phone] Subjective:   Tara Wheeler, am serving as a scribe for Dr. Antoine Primas.  I'm seeing this patient by the request  of:  Patient, No Pcp Per  CC: Bilateral neck pain, shoulder pain  [id number]  12/04/2022 Patient given injections today and tolerated the procedure well, discussed icing regimen and home exercises, discussed which activities to do and which ones to avoid. Patient is seeing other providers closer to her home in [place]. Do think that this will be significantly more beneficial. Discussed icing regimen and home exercises otherwise. Patient can follow-up with me again in 2 months but hopeful that patient will find care closer to her so this is not as much difficulty coming to see [place].   Update 03/12/2023 Tara Wheeler is a 37 y.o. female coming in with complaint of L shoulder pain. Patient states that the weather changes have caused an increase in body achiness. Pain in back, neck, wrist. Notices swelling in L middle deltoid.        Past Medical History:  Diagnosis Date   Asthma    Miscarriage    Pregnancy    Pregnancy induced hypertension    Scoliosis    Past Surgical History:  Procedure Laterality Date   NO PAST SURGERIES     Social History   Socioeconomic History   Marital status: Single    Spouse name: Not on file   Number of children: Not on file   Years of education: Not on file   Highest education level: Not on file  Occupational History   Not on file  Tobacco Use   Smoking status: Never   Smokeless tobacco: Never  Substance and Sexual Activity   Alcohol use: No   Drug use: No   Sexual activity: Yes  Other Topics Concern   Not on file  Social History Narrative   Not on file   Social Determinants of Health   Financial Resource Strain: Not on file  Food Insecurity: Not on file  Transportation Needs: Not on file  Physical Activity: Not  on file  Stress: Not on file  Social Connections: Not on file   No Known Allergies Family History  Problem Relation Age of Onset   Hypertension Mother    Hypertension Father    Diabetes Maternal Aunt       Current Outpatient Medications (Respiratory):    albuterol (PROVENTIL HFA;VENTOLIN HFA) 108 (90 Base) MCG/ACT inhaler, Inhale 2 puffs into the lungs every 6 (six) hours as needed for wheezing or shortness of breath.      Reviewed prior external information including notes and imaging from  primary care provider As well as notes that were available from care everywhere and other healthcare systems.  Past medical history, social, surgical and family history all reviewed in electronic medical record.  No pertanent information unless stated regarding to the chief complaint.   Review of Systems:  No, visual changes, nausea, vomiting, diarrhea, constipation, dizziness, abdominal pain, skin rash, fevers, chills, night sweats, weight loss, swollen lymph nodes,  chest pain, shortness of breath, mood changes. POSITIVE muscle aches, body aches, joint swelling, headaches  Objective  Blood pressure 120/78, pulse 69, height 5\' 6"  (1.676 m), weight 217 lb (98.4 kg), SpO2 96 %.   General: No apparent distress alert and oriented x3 mood and affect normal, dressed appropriately.  HEENT: Pupils equal, extraocular movements intact  Respiratory: Patient's speak in full sentences  and does not appear short of breath  Cardiovascular: No lower extremity edema, non tender, no erythema  Tightness in the left shoulder noted.  Tenderness to palpation in the right and left shoulder region.  Seems to be with tightness in the neck.  Limited sidebending bilaterally.  Osteopathic findings C2 flexed rotated and side bent right C4 flexed rotated and side bent left C6 flexed rotated and side bent left T3 extended rotated and side bent right inhaled third rib T9 extended rotated and side bent left L2 flexed  rotated and side bent right Sacrum right on right   After verbal consent patient was prepped with alcohol swab and with a 25-gauge half inch needle injected in both bilateral rhomboids trapezius and levator scapula's.  Total of 3 cc of 0.5% Marcaine and 1 cc of Kenalog 40 mg/mL used.  Minimal to no blood loss.  Band-Aid placed.  Postinjection instructions given.    Impression and Recommendations:     The above documentation has been reviewed and is accurate and complete Judi Saa, DO

## 2023-03-12 ENCOUNTER — Ambulatory Visit: Payer: Medicaid Other | Admitting: Family Medicine

## 2023-03-12 ENCOUNTER — Encounter: Payer: Self-pay | Admitting: Family Medicine

## 2023-03-12 ENCOUNTER — Ambulatory Visit (INDEPENDENT_AMBULATORY_CARE_PROVIDER_SITE_OTHER): Payer: Medicaid Other

## 2023-03-12 VITALS — BP 120/78 | HR 69 | Ht 66.0 in | Wt 217.0 lb

## 2023-03-12 DIAGNOSIS — M25511 Pain in right shoulder: Secondary | ICD-10-CM

## 2023-03-12 DIAGNOSIS — M9904 Segmental and somatic dysfunction of sacral region: Secondary | ICD-10-CM | POA: Diagnosis not present

## 2023-03-12 DIAGNOSIS — M9902 Segmental and somatic dysfunction of thoracic region: Secondary | ICD-10-CM

## 2023-03-12 DIAGNOSIS — G8929 Other chronic pain: Secondary | ICD-10-CM

## 2023-03-12 DIAGNOSIS — M9908 Segmental and somatic dysfunction of rib cage: Secondary | ICD-10-CM

## 2023-03-12 DIAGNOSIS — M9903 Segmental and somatic dysfunction of lumbar region: Secondary | ICD-10-CM

## 2023-03-12 DIAGNOSIS — M545 Low back pain, unspecified: Secondary | ICD-10-CM | POA: Diagnosis not present

## 2023-03-12 DIAGNOSIS — M25512 Pain in left shoulder: Secondary | ICD-10-CM

## 2023-03-12 DIAGNOSIS — M9901 Segmental and somatic dysfunction of cervical region: Secondary | ICD-10-CM

## 2023-03-12 DIAGNOSIS — M2550 Pain in unspecified joint: Secondary | ICD-10-CM | POA: Diagnosis not present

## 2023-03-12 DIAGNOSIS — D508 Other iron deficiency anemias: Secondary | ICD-10-CM

## 2023-03-12 LAB — COMPREHENSIVE METABOLIC PANEL
ALT: 24 U/L (ref 0–35)
AST: 17 U/L (ref 0–37)
Albumin: 3.8 g/dL (ref 3.5–5.2)
Alkaline Phosphatase: 114 U/L (ref 39–117)
BUN: 6 mg/dL (ref 6–23)
CO2: 26 mEq/L (ref 19–32)
Calcium: 8.5 mg/dL (ref 8.4–10.5)
Chloride: 104 mEq/L (ref 96–112)
Creatinine, Ser: 0.83 mg/dL (ref 0.40–1.20)
GFR: 91.67 mL/min (ref >60.00)
Glucose, Bld: 78 mg/dL (ref 70–99)
Potassium: 3.6 mEq/L (ref 3.5–5.1)
Sodium: 139 mEq/L (ref 135–145)
Total Bilirubin: 0.2 mg/dL (ref 0.2–1.2)
Total Protein: 7.5 g/dL (ref 6.0–8.3)

## 2023-03-12 LAB — CBC WITH DIFFERENTIAL/PLATELET
Basophils Absolute: 0 10*3/uL (ref 0.0–0.1)
Basophils Relative: 0.5 % (ref 0.0–3.0)
Eosinophils Absolute: 0.2 10*3/uL (ref 0.0–0.7)
Eosinophils Relative: 1.9 % (ref 0.0–5.0)
HCT: 35.6 % — ABNORMAL LOW (ref 36.0–46.0)
Hemoglobin: 11.6 g/dL — ABNORMAL LOW (ref 12.0–15.0)
Lymphocytes Relative: 30.9 % (ref 12.0–46.0)
Lymphs Abs: 2.9 10*3/uL (ref 0.7–4.0)
MCHC: 32.6 g/dL (ref 30.0–36.0)
MCV: 86.3 fl (ref 78.0–100.0)
Monocytes Absolute: 0.7 10*3/uL (ref 0.1–1.0)
Monocytes Relative: 8 % (ref 3.0–12.0)
Neutro Abs: 5.5 10*3/uL (ref 1.4–7.7)
Neutrophils Relative %: 58.7 % (ref 43.0–77.0)
Platelets: 447 10*3/uL — ABNORMAL HIGH (ref 150.0–400.0)
RBC: 4.12 Mil/uL (ref 3.87–5.11)
RDW: 18.5 % — ABNORMAL HIGH (ref 11.5–15.5)
WBC: 9.3 10*3/uL (ref 4.0–10.5)

## 2023-03-12 LAB — IBC PANEL
Iron: 40 ug/dL — ABNORMAL LOW (ref 42–145)
Saturation Ratios: 10.7 % — ABNORMAL LOW (ref 20.0–50.0)
TIBC: 373.8 ug/dL (ref 250.0–450.0)
Transferrin: 267 mg/dL (ref 212.0–360.0)

## 2023-03-12 LAB — C-REACTIVE PROTEIN: CRP: <1 mg/dL (ref 0.5–20.0)

## 2023-03-12 LAB — SEDIMENTATION RATE: Sed Rate: 84 mm/hr — ABNORMAL HIGH (ref 0–20)

## 2023-03-12 LAB — URIC ACID: Uric Acid, Serum: 4.3 mg/dL (ref 2.4–7.0)

## 2023-03-12 LAB — TSH: TSH: 1.35 u[IU]/mL (ref 0.35–5.50)

## 2023-03-12 LAB — VITAMIN D 25 HYDROXY (VIT D DEFICIENCY, FRACTURES): VITD: 21.31 ng/mL — ABNORMAL LOW (ref 30.00–100.00)

## 2023-03-12 LAB — FERRITIN: Ferritin: 21.9 ng/mL (ref 10.0–291.0)

## 2023-03-12 LAB — VITAMIN B12: Vitamin B-12: 443 pg/mL (ref 211–911)

## 2023-03-12 NOTE — Assessment & Plan Note (Signed)
Responded extremely well to osteopathic manipulation but continues to have fatigue.  Could be secondary to the iron deficiency again.  Will recheck to see where patient's labs are.  Follow-up again in 6 to 8 weeks

## 2023-03-12 NOTE — Assessment & Plan Note (Signed)
Recheck labs to make sure that this is not worsening again.

## 2023-03-12 NOTE — Assessment & Plan Note (Signed)
Repeat trigger point injections given again in the right shoulder region.  Did do some in the left shoulder as well.  We discussed icing regimen and home exercises.  Discussed the potential for cervical radiculopathy.  Still concerned with some of the polyarthralgia and the low iron that could be contributing as well.  Discussed with patient about vitamin D deficiency.  Follow-up again in 6 to 8 weeks

## 2023-03-12 NOTE — Patient Instructions (Addendum)
Lab and xray today Trigger point injections See me in 3 months

## 2023-03-13 ENCOUNTER — Other Ambulatory Visit: Payer: Self-pay

## 2023-03-13 ENCOUNTER — Encounter: Payer: Self-pay | Admitting: Family Medicine

## 2023-03-13 DIAGNOSIS — D649 Anemia, unspecified: Secondary | ICD-10-CM

## 2023-03-13 DIAGNOSIS — R700 Elevated erythrocyte sedimentation rate: Secondary | ICD-10-CM

## 2023-03-13 MED ORDER — VITAMIN D (ERGOCALCIFEROL) 1.25 MG (50000 UNIT) PO CAPS: 50000.0000 [IU] | ORAL_CAPSULE | ORAL | 0 refills | Status: AC

## 2023-03-14 LAB — PTH, INTACT AND CALCIUM
Calcium: 8.8 mg/dL (ref 8.6–10.2)
PTH: 71 pg/mL (ref 16–77)

## 2023-03-14 LAB — ANTI-NUCLEAR AB-TITER (ANA TITER)
ANA TITER: 1:40 {titer} — ABNORMAL HIGH
ANA Titer 1: 1:40 {titer} — ABNORMAL HIGH

## 2023-03-14 LAB — CYCLIC CITRUL PEPTIDE ANTIBODY, IGG: Cyclic Citrullin Peptide Ab: <16 UNITS

## 2023-03-14 LAB — ANA: Anti Nuclear Antibody (ANA): POSITIVE — AB

## 2023-03-14 LAB — RHEUMATOID FACTOR: Rheumatoid fact SerPl-aCnc: <10 IU/mL (ref <14)

## 2023-03-14 LAB — ANGIOTENSIN CONVERTING ENZYME: Angiotensin-Converting Enzyme: 25 U/L (ref 9–67)

## 2023-03-14 LAB — CALCIUM, IONIZED: Calcium, Ion: 5.1 mg/dL (ref 4.7–5.5)

## 2023-03-19 ENCOUNTER — Telehealth: Payer: Self-pay | Admitting: Family Medicine

## 2023-03-19 NOTE — Telephone Encounter (Signed)
Patient called stating that Dr Katrinka Blazing has mentioned some type of treatment for her arm but she couldn't remember what it was. Can you help? She said it might have been electric something?  Please advise.

## 2023-03-20 NOTE — Telephone Encounter (Signed)
Sent patient MyChart message.

## 2023-06-12 ENCOUNTER — Ambulatory Visit: Payer: Medicaid Other | Admitting: Family Medicine

## 2023-07-23 ENCOUNTER — Encounter: Payer: Medicaid Other | Admitting: Internal Medicine
